# Patient Record
Sex: Female | Born: 2000 | Race: White | Hispanic: No | Marital: Single | Smoking: Never smoker
Health system: Southern US, Community
[De-identification: ages and names within clinical notes are randomized; demographics above are authoritative.]

## PROBLEM LIST (undated history)

## (undated) DIAGNOSIS — N760 Acute vaginitis: Secondary | ICD-10-CM

## (undated) DIAGNOSIS — A749 Chlamydial infection, unspecified: Secondary | ICD-10-CM

## (undated) DIAGNOSIS — B9689 Other specified bacterial agents as the cause of diseases classified elsewhere: Secondary | ICD-10-CM

## (undated) HISTORY — DX: Chlamydial infection, unspecified: A74.9

## (undated) HISTORY — DX: Other specified bacterial agents as the cause of diseases classified elsewhere: N76.0

## (undated) HISTORY — DX: Other specified bacterial agents as the cause of diseases classified elsewhere: B96.89

---
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
## 2018-01-04 IMAGING — MR MR LUMBAR SPINE W/O CM
4 of 5 series · 15 of 48 positions shown · non-contrast
Comparison: None.

CLINICAL DATA: Low back pain with bilateral groin pain over the
last 3 months.

EXAM:
MRI LUMBAR SPINE WITHOUT CONTRAST
TECHNIQUE: Multiplanar, multisequence MR imaging of the lumbar spine was
performed. No intravenous contrast was administered.

[Series 3: T2 · sagittal · 4.0mm · 0.44mm/px · 6 of 15 slices shown (1 of 2)]
[im 1/15]
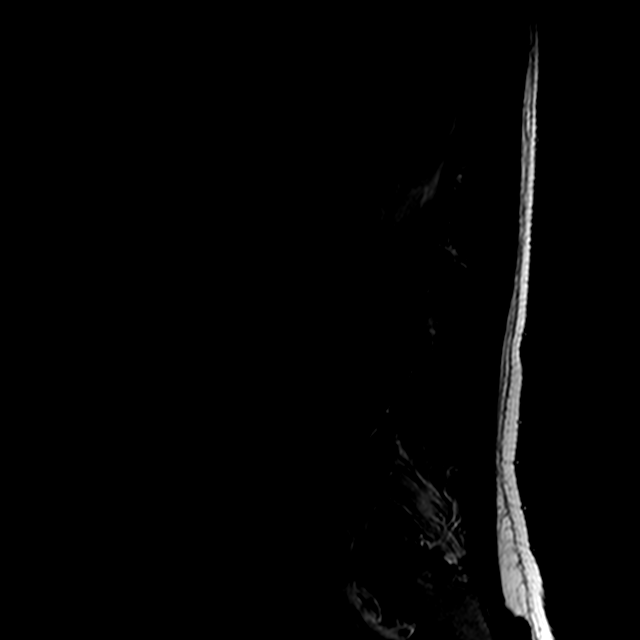
[im 3/15]
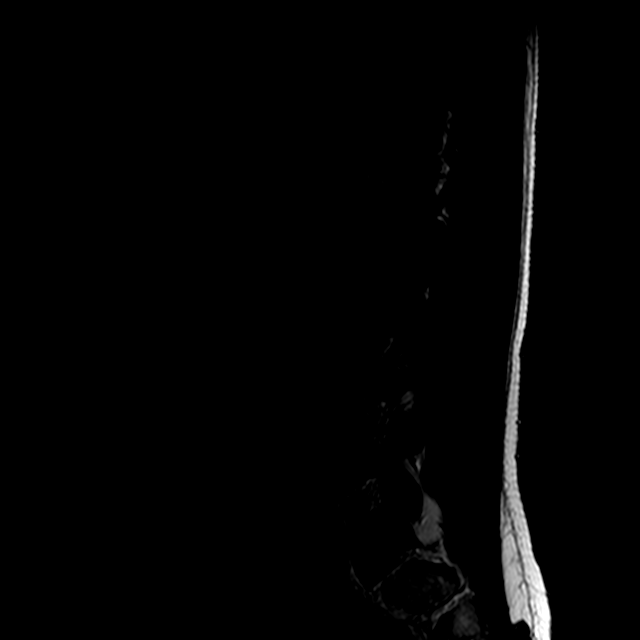
[im 6/15]
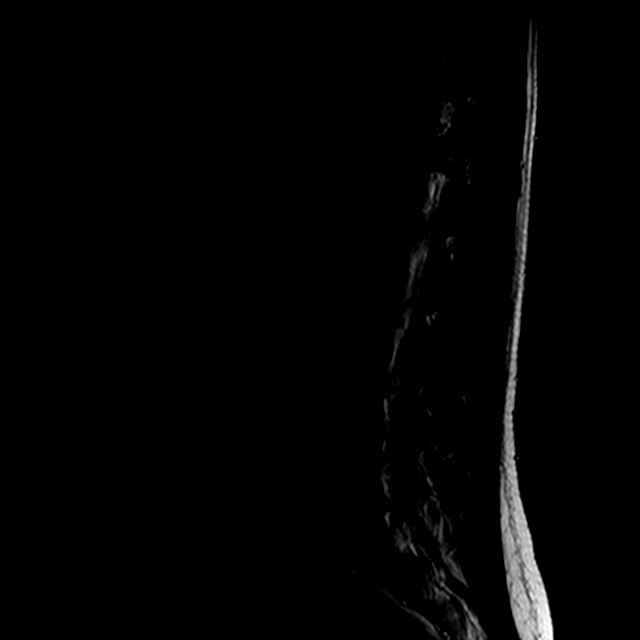
[im 9/15]
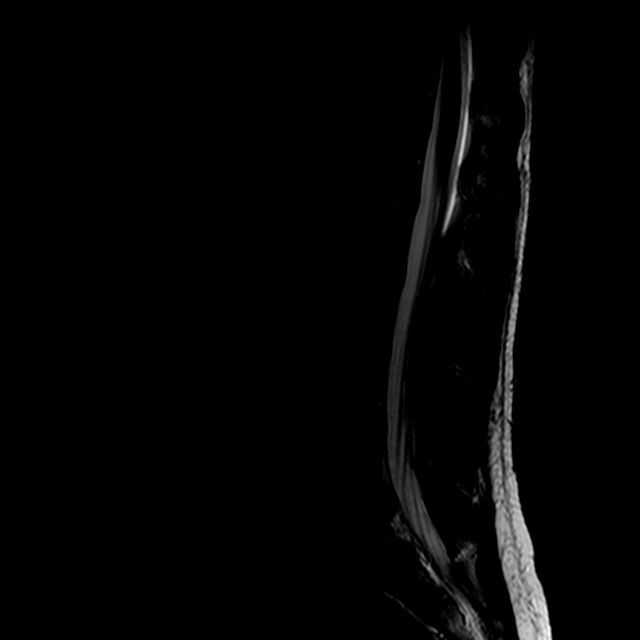
[im 12/15]
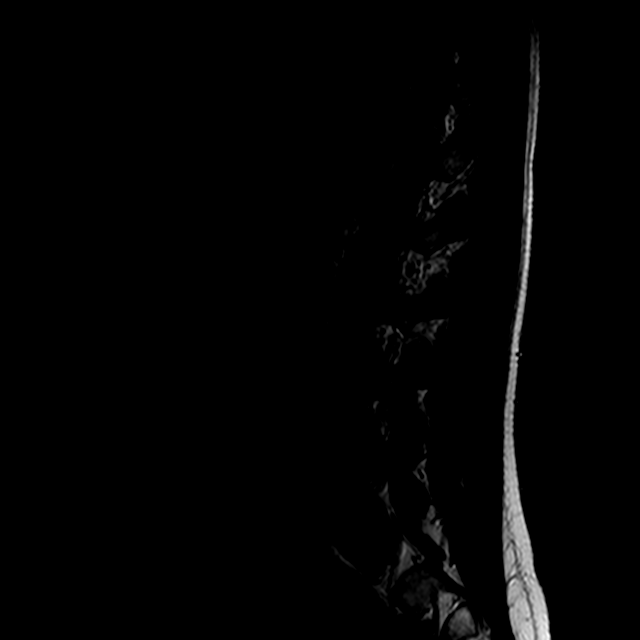
[im 15/15]
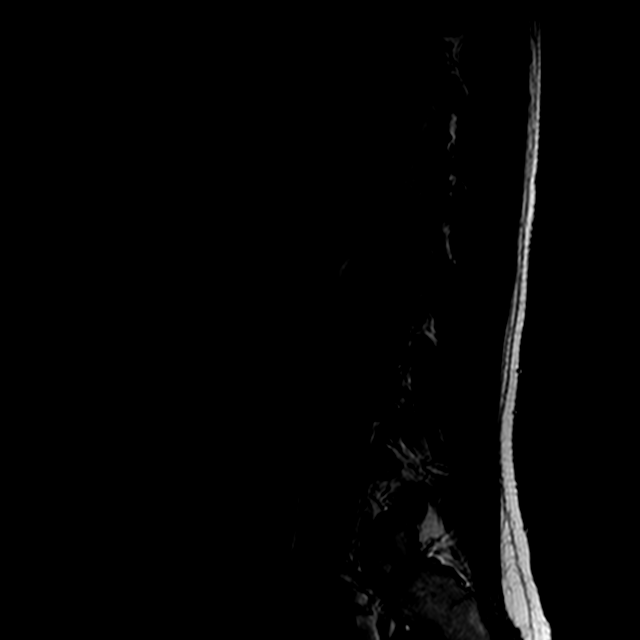

[Series 4: T1 · sagittal · 4.0mm · 0.44mm/px · 3 of 15 slices shown (1 of 2)]
[im 3/15]
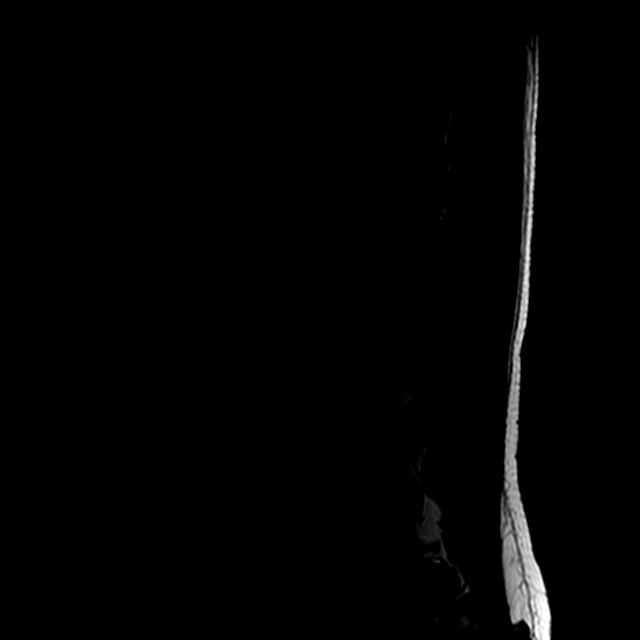
[im 9/15]
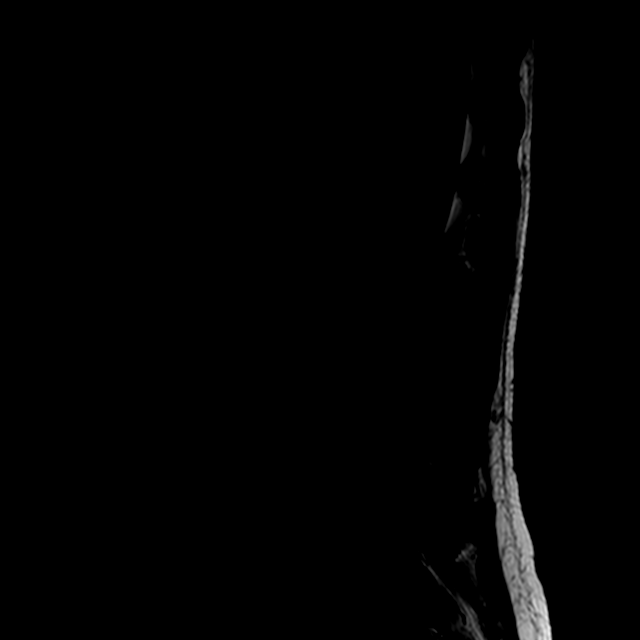
[im 15/15]
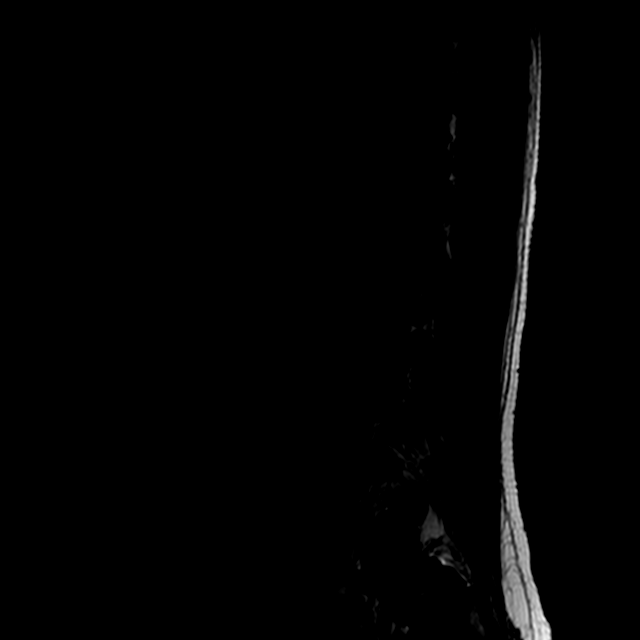

[Series 6: T2 · axial · 4.0mm · 0.39mm/px · z∈[-64,+92]mm · 3 of 38 slices shown (2 of 2)]
[im 6/38]
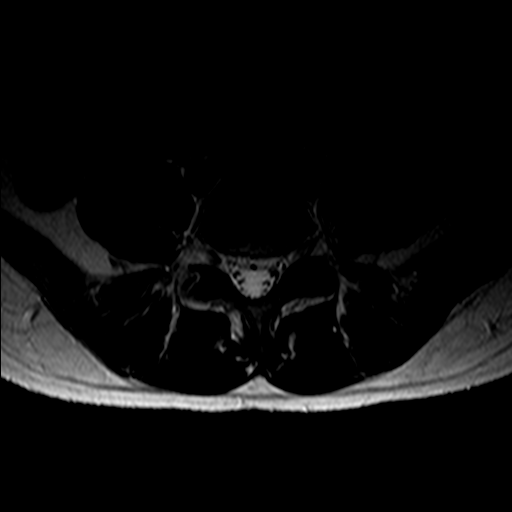
[im 19/38]
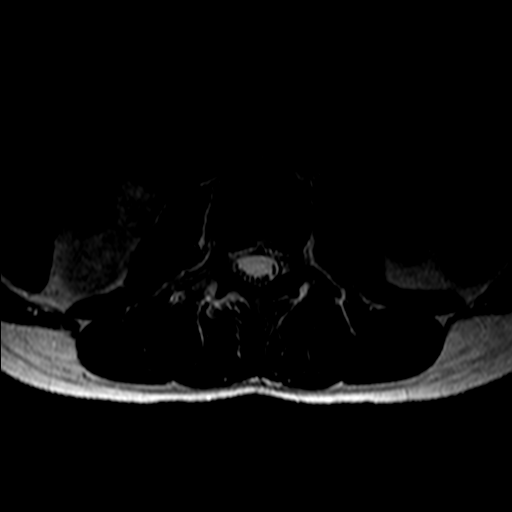
[im 32/38]
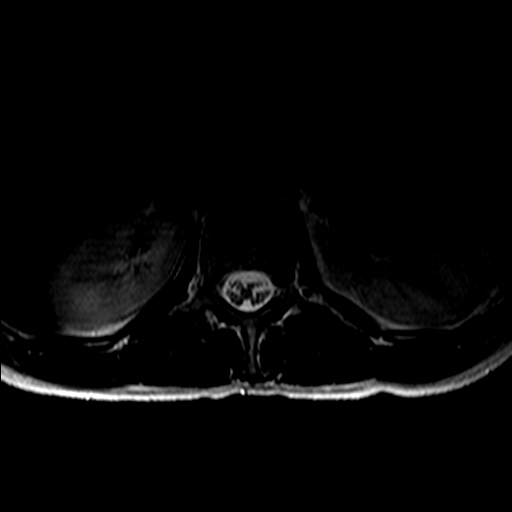

[Series 7: T1 · axial · 4.0mm · 0.39mm/px · z∈[-64,+92]mm · 3 of 38 slices shown (2 of 2)]
[im 6/38]
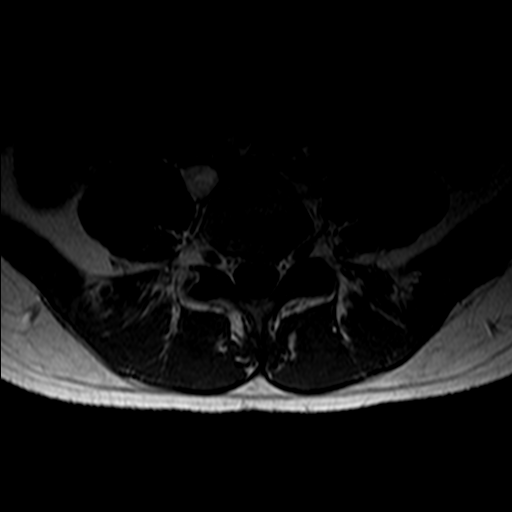
[im 19/38]
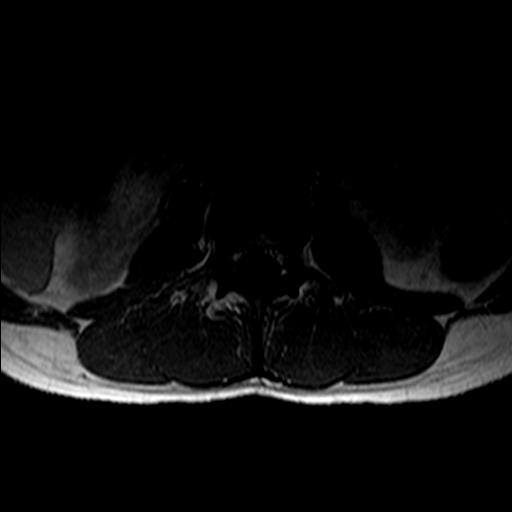
[im 32/38]
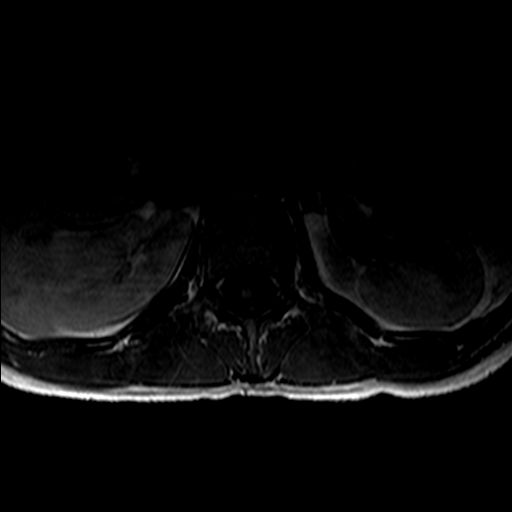

[15 of 48 positions shown; findings below may reference images not displayed]

FINDINGS: Segmentation:  5 lumbar type vertebral bodies.

Alignment:  Normal

Vertebrae:  Normal

Conus medullaris: Extends to the L1 level and appears normal.

Paraspinal and other soft tissues: Normal

Disc levels:

No abnormality at L4-5 or above. The discs are normal in signal
characteristics and morphology. Canal and foramina are widely
patent.

L5-S1: Disc degeneration with a central disc herniation. This
approaches the thecal sac in the S1 nerves but does not cause neural
compression or displacement. This could certainly be a cause of
symptoms.
IMPRESSION: Disc degeneration at L5-S1 with a central disc herniation. This does
not cause neural compression, but could be responsible for the
presenting symptoms.

## 2019-09-04 ENCOUNTER — Encounter: Payer: Self-pay | Admitting: Obstetrics and Gynecology

## 2020-09-16 ENCOUNTER — Other Ambulatory Visit: Payer: Self-pay

## 2020-09-16 ENCOUNTER — Encounter: Payer: Self-pay | Admitting: Obstetrics and Gynecology

## 2020-09-16 ENCOUNTER — Other Ambulatory Visit (HOSPITAL_COMMUNITY)
Admission: RE | Admit: 2020-09-16 | Discharge: 2020-09-16 | Disposition: A | Discharge status: Home / Self Care | Payer: BC Managed Care – PPO | Source: Ambulatory Visit | Attending: Obstetrics and Gynecology | Admitting: Obstetrics and Gynecology

## 2020-09-16 ENCOUNTER — Ambulatory Visit (INDEPENDENT_AMBULATORY_CARE_PROVIDER_SITE_OTHER): Payer: BC Managed Care – PPO | Admitting: Obstetrics and Gynecology

## 2020-09-16 VITALS — BP 120/60 | Ht 67.0 in | Wt 122.0 lb

## 2020-09-16 DIAGNOSIS — Z30011 Encounter for initial prescription of contraceptive pills: Secondary | ICD-10-CM

## 2020-09-16 DIAGNOSIS — N760 Acute vaginitis: Secondary | ICD-10-CM

## 2020-09-16 DIAGNOSIS — Z124 Encounter for screening for malignant neoplasm of cervix: Secondary | ICD-10-CM | POA: Diagnosis not present

## 2020-09-16 DIAGNOSIS — Z01419 Encounter for gynecological examination (general) (routine) without abnormal findings: Secondary | ICD-10-CM | POA: Diagnosis not present

## 2020-09-16 DIAGNOSIS — B9689 Other specified bacterial agents as the cause of diseases classified elsewhere: Secondary | ICD-10-CM | POA: Diagnosis not present

## 2020-09-16 DIAGNOSIS — Z113 Encounter for screening for infections with a predominantly sexual mode of transmission: Secondary | ICD-10-CM | POA: Insufficient documentation

## 2020-09-16 LAB — POCT WET PREP WITH KOH
Clue Cells Wet Prep HPF POC: POSITIVE
KOH Prep POC: POSITIVE — AB
Trichomonas, UA: NEGATIVE
Yeast Wet Prep HPF POC: NEGATIVE

## 2020-09-16 MED ORDER — DROSPIRENONE-ETHINYL ESTRADIOL 3-0.02 MG PO TABS: 1.0000 | ORAL_TABLET | Freq: Every day | ORAL | 11 refills | Status: DC

## 2020-09-16 MED ORDER — METRONIDAZOLE 500 MG PO TABS: 500.0000 mg | ORAL_TABLET | Freq: Two times a day (BID) | ORAL | 0 refills | Status: DC

## 2020-09-16 NOTE — Progress Notes (Signed)
PCP:  Erick Colace, MD   Chief Complaint  Patient presents with  . Gynecologic Exam  . Contraception    wants BC, currently no BC     HPI:      Ms. Misty Garza is a 19 y.o. G0P0 whose LMP was Patient's last menstrual period was 09/02/2020 (approximate)., presents today for her NP annual examination.  Her menses are regular every 28-30 days, lasting 5 days.  Dysmenorrhea mild, occurring first 1-2 days of flow. She does not have intermenstrual bleeding.  Sex activity: single partner, contraception - condoms sometimes. Did sprintec in the past with mood changes. Would like new BC. No hx of HTN, DVTs, migraines with aura.  Last Pap: never due to age Hx of STDs: chlamydia 2 months ago, treated with doxy and rocephin. Had neg TOC, partner treated.  Having issues with fishy vag odor, no d/c or irritation. Has tried repHresh. No hx of BV.   There is no FH of breast cancer. There is no FH of ovarian cancer. The patient does not do self-breast exams.  Tobacco use: The patient denies current or previous tobacco use. Alcohol use: none No drug use.  Exercise: moderately active  She does get adequate calcium but not Vitamin D in her diet. Gardasil not done.    Past Medical History:  Diagnosis Date  . Chlamydia     Past Surgical History:  Procedure Laterality Date  . tubs in ears  2002    History reviewed. No pertinent family history.  Social History   Socioeconomic History  . Marital status: Single    Spouse name: Not on file  . Number of children: Not on file  . Years of education: Not on file  . Highest education level: Not on file  Occupational History  . Not on file  Tobacco Use  . Smoking status: Never Smoker  . Smokeless tobacco: Never Used  Vaping Use  . Vaping Use: Never used  Substance and Sexual Activity  . Alcohol use: No  . Drug use: No  . Sexual activity: Yes    Birth control/protection: Pill, Condom  Other Topics Concern  . Not on file  Social  History Narrative  . Not on file   Social Determinants of Health   Financial Resource Strain:   . Difficulty of Paying Living Expenses: Not on file  Food Insecurity:   . Worried About Programme researcher, broadcasting/film/video in the Last Year: Not on file  . Ran Out of Food in the Last Year: Not on file  Transportation Needs:   . Lack of Transportation (Medical): Not on file  . Lack of Transportation (Non-Medical): Not on file  Physical Activity:   . Days of Exercise per Week: Not on file  . Minutes of Exercise per Session: Not on file  Stress:   . Feeling of Stress : Not on file  Social Connections:   . Frequency of Communication with Friends and Family: Not on file  . Frequency of Social Gatherings with Friends and Family: Not on file  . Attends Religious Services: Not on file  . Active Member of Clubs or Organizations: Not on file  . Attends Banker Meetings: Not on file  . Marital Status: Not on file  Intimate Partner Violence:   . Fear of Current or Ex-Partner: Not on file  . Emotionally Abused: Not on file  . Physically Abused: Not on file  . Sexually Abused: Not on file     Current  Outpatient Medications:  .  drospirenone-ethinyl estradiol (YAZ) 3-0.02 MG tablet, Take 1 tablet by mouth daily., Disp: 28 tablet, Rfl: 11 .  metroNIDAZOLE (FLAGYL) 500 MG tablet, Take 1 tablet (500 mg total) by mouth 2 (two) times daily for 7 days., Disp: 14 tablet, Rfl: 0     ROS:  Review of Systems  Constitutional: Negative for fatigue, fever and unexpected weight change.  Respiratory: Negative for cough, shortness of breath and wheezing.   Cardiovascular: Negative for chest pain, palpitations and leg swelling.  Gastrointestinal: Negative for blood in stool, constipation, diarrhea, nausea and vomiting.  Endocrine: Negative for cold intolerance, heat intolerance and polyuria.  Genitourinary: Negative for dyspareunia, dysuria, flank pain, frequency, genital sores, hematuria, menstrual  problem, pelvic pain, urgency, vaginal bleeding, vaginal discharge and vaginal pain.  Musculoskeletal: Negative for back pain, joint swelling and myalgias.  Skin: Negative for rash.  Neurological: Negative for dizziness, syncope, light-headedness, numbness and headaches.  Hematological: Negative for adenopathy.  Psychiatric/Behavioral: Negative for agitation, confusion, sleep disturbance and suicidal ideas. The patient is not nervous/anxious.    BREAST: No symptoms   Objective: BP 120/60   Ht 5\' 7"  (1.702 m)   Wt 122 lb (55.3 kg)   LMP 09/02/2020 (Approximate)   BMI 19.11 kg/m    Physical Exam Constitutional:      Appearance: She is well-developed.  Genitourinary:     Vulva, cervix, uterus, right adnexa and left adnexa normal.     No vulval lesion or tenderness noted.     Vaginal discharge present.     No vaginal erythema or tenderness.     No cervical polyp.     Uterus is not enlarged or tender.     No right or left adnexal mass present.     Right adnexa not tender.     Left adnexa not tender.  Neck:     Thyroid: No thyromegaly.  Cardiovascular:     Rate and Rhythm: Normal rate and regular rhythm.     Heart sounds: Normal heart sounds. No murmur heard.   Pulmonary:     Effort: Pulmonary effort is normal.     Breath sounds: Normal breath sounds.  Chest:     Breasts:        Right: No mass, nipple discharge, skin change or tenderness.        Left: No mass, nipple discharge, skin change or tenderness.  Abdominal:     Palpations: Abdomen is soft.     Tenderness: There is no abdominal tenderness. There is no guarding.  Musculoskeletal:        General: Normal range of motion.     Cervical back: Normal range of motion.  Neurological:     General: No focal deficit present.     Mental Status: She is alert and oriented to person, place, and time.     Cranial Nerves: No cranial nerve deficit.  Skin:    General: Skin is warm and dry.  Psychiatric:        Mood and Affect:  Mood normal.        Behavior: Behavior normal.        Thought Content: Thought content normal.        Judgment: Judgment normal.  Vitals reviewed.     Results: Results for orders placed or performed in visit on 09/16/20 (from the past 24 hour(s))  POCT Wet Prep with KOH     Status: Abnormal   Collection Time: 09/16/20  5:02 PM  Result  Value Ref Range   Trichomonas, UA Negative    Clue Cells Wet Prep HPF POC pos    Epithelial Wet Prep HPF POC     Yeast Wet Prep HPF POC neg    Bacteria Wet Prep HPF POC     RBC Wet Prep HPF POC     WBC Wet Prep HPF POC     KOH Prep POC Positive (A) Negative    Assessment/Plan: Encounter for annual routine gynecological examination  Cervical cancer screening - Plan: Cytology - PAP  Screening for STD (sexually transmitted disease) - Plan: Cytology - PAP  Encounter for initial prescription of contraceptive pills - Plan: drospirenone-ethinyl estradiol (YAZ) 3-0.02 MG tablet; BC options discussed. Pt wants to try OCPs again. Rx yaz, start with next menses, condoms for 1 wk. F/u prn mood changes.   Bacterial vaginosis - Plan: metroNIDAZOLE (FLAGYL) 500 MG tablet, POCT Wet Prep with KOH; pos sx and wet prep. Rx flagyl, no EtOH. Will RF if sx recur. F/u prn.   Meds ordered this encounter  Medications  . metroNIDAZOLE (FLAGYL) 500 MG tablet    Sig: Take 1 tablet (500 mg total) by mouth 2 (two) times daily for 7 days.    Dispense:  14 tablet    Refill:  0    Order Specific Question:   Supervising Provider    Answer:   Nadara Mustard [id number]  . drospirenone-ethinyl estradiol (YAZ) 3-0.02 MG tablet    Sig: Take 1 tablet by mouth daily.    Dispense:  28 tablet    Refill:  11    Order Specific Question:   Supervising Provider    Answer:   Nadara Mustard [[id number]]             GYN counsel STD prevention, adequate intake of calcium and vitamin D, diet and exercise, Gardasil discussed     F/U  Return in about 1 year (around 09/16/2021).  Misty Garza  B. Sally-Ann Cutbirth, PA-C 09/16/2020 5:02 PM

## 2020-09-16 NOTE — Patient Instructions (Signed)
I value your feedback and entrusting us with your care. If you get a [facility] patient survey, I would appreciate you taking the time to let us know about your experience today. Thank you!  As of October 10, 2019, your lab results will be released to your MyChart immediately, before I even have a chance to see them. Please give me time to review them and contact you if there are any abnormalities. Thank you for your patience.  

## 2020-09-21 LAB — CYTOLOGY - PAP
Chlamydia: NEGATIVE
Comment: NEGATIVE
Comment: NORMAL
Diagnosis: NEGATIVE
Neisseria Gonorrhea: NEGATIVE

## 2020-10-12 ENCOUNTER — Other Ambulatory Visit: Payer: Self-pay | Admitting: Obstetrics and Gynecology

## 2020-10-12 DIAGNOSIS — B9689 Other specified bacterial agents as the cause of diseases classified elsewhere: Secondary | ICD-10-CM

## 2020-10-12 MED ORDER — METRONIDAZOLE 500 MG PO TABS: 500.0000 mg | ORAL_TABLET | Freq: Two times a day (BID) | ORAL | 0 refills | Status: AC

## 2020-10-12 NOTE — Progress Notes (Signed)
Rx RF flagyl for BV sx.  

## 2021-02-10 DIAGNOSIS — N3000 Acute cystitis without hematuria: Secondary | ICD-10-CM | POA: Diagnosis not present

## 2021-02-10 DIAGNOSIS — R300 Dysuria: Secondary | ICD-10-CM | POA: Diagnosis not present

## 2021-02-16 DIAGNOSIS — L0231 Cutaneous abscess of buttock: Secondary | ICD-10-CM | POA: Diagnosis not present

## 2021-02-16 DIAGNOSIS — Z880 Allergy status to penicillin: Secondary | ICD-10-CM | POA: Diagnosis not present

## 2021-02-16 DIAGNOSIS — L03317 Cellulitis of buttock: Secondary | ICD-10-CM | POA: Diagnosis not present

## 2021-02-16 DIAGNOSIS — F909 Attention-deficit hyperactivity disorder, unspecified type: Secondary | ICD-10-CM | POA: Diagnosis not present

## 2021-02-16 DIAGNOSIS — Z79899 Other long term (current) drug therapy: Secondary | ICD-10-CM | POA: Diagnosis not present

## 2021-06-06 ENCOUNTER — Ambulatory Visit
Admission: EM | Admit: 2021-06-06 | Discharge: 2021-06-06 | Disposition: A | Discharge status: Home / Self Care | Payer: BC Managed Care – PPO

## 2021-06-06 ENCOUNTER — Encounter: Payer: Self-pay | Admitting: Emergency Medicine

## 2021-06-06 DIAGNOSIS — S00451A Superficial foreign body of right ear, initial encounter: Secondary | ICD-10-CM

## 2021-06-06 NOTE — ED Provider Notes (Addendum)
Chief Complaint   Chief Complaint  Patient presents with   Foreign Body in Ear    Rt ear lobe     Subjective, HPI  Misty Garza is a very pleasant 20 y.o. female who presents with earring stuck in right ear which she noticed about an hour ago.  Patient states that she had the ear pierced about a month ago.  She states that she has been using saline sprays to the area which has not seemed to help.  Patient states that she has been unable to dislodge the earring.  Patient does report some swelling in the area.  She does not report any fever, chills, vomiting or associated headache.  History obtained from patient.   Patient's problem list, past medical and social history, medications, and allergies were reviewed by me and updated in Epic.    ROS  See HPI.  Objective   Vitals:   06/06/21 1448  BP: 114/77  Pulse: 86  Resp: 18  Temp: 98.8 F (37.1 C)  SpO2: 99%    Vital signs and nursing note reviewed.  General: Appears well-developed and well-nourished. No acute distress.  HEENT: Normocephalic, atraumatic, hearing grossly intact. EOMI, no drainage. No rhinorrhea. Moist mucous membranes.  Neck: Normal range of motion, neck is supple.  Cardiovascular: Normal rate.  Pulm/Chest: No respiratory distress.   Musculoskeletal: No joint deformity, normal range of motion.  Skin: Erythematous swollen right earlobe with embedded earring beneath the skin of the anterior earlobe.  Some mild drainage of blood noted.  Procedure: Area cleaned in the usual fashion with alcohol and saline.  Injected approximately 0.5 mL of 1% lidocaine without epinephrine to the posterior and anterior aspect of the right ear.  Able to press the back of the earring to push the anterior portion of the earring through the patient's ear, removed the back of the earring and was able to remove the earring in its entirety.  Patient tolerated well.  Area cleaned with alcohol and saline, bleeding controlled.   Approximately 1 mL of blood loss.  Applied topical bacitracin ointment.  Data  No results found for any visits on 06/06/21.   Assessment & Plan  1. Foreign body of right ear lobe, initial encounter  20 y.o. female presents with earring stuck in right ear which she noticed about an hour ago.  Patient states that she had the ear pierced about a month ago.  She states that she has been using saline sprays to the area which has not seemed to help.  Patient states that she has been unable to dislodge the earring.  Patient does report some swelling in the area.  She does not report any fever, chills, vomiting or associated headache.  Chart review completed.  Earring able to be successfully removed from the patient's ear.  There is associated mild swelling, but no concern for infection at this time.  Injected 0.5 mL of 1% lidocaine without epinephrine during procedure.  Advised about home treatment and care as outlined in her AVS to include saline sprays, keeping the area clean and dry, use of topical triple antibiotic ointment, and watching for signs and symptoms of infection to include fever, chills, pus drainage, worsening swelling.  Patient verbalized understanding and agreed with plan.  Patient stable upon discharge.  Return as needed.  Visit for procedure only.  Plan:   Discharge Instructions      Keep the area clean and dry.  Clean the area with saline spray.  Topical antibiotic ointment  as directed.  If you begin to notice signs or symptoms of infection to include fever, chills, pus drainage or worsening swelling return to clinic for reevaluation.         Amalia Greenhouse, FNP 06/06/21 1610    Amalia Greenhouse, [place] 06/06/21 [phone]

## 2021-06-06 NOTE — Discharge Instructions (Addendum)
Keep the area clean and dry.  Clean the area with saline spray.  Topical antibiotic ointment as directed.  If you begin to notice signs or symptoms of infection to include fever, chills, pus drainage or worsening swelling return to clinic for reevaluation.

## 2021-06-06 NOTE — ED Triage Notes (Signed)
Pt presents today with piece of earring stuck in right ear x 1 hr.

## 2022-11-14 ENCOUNTER — Encounter: Payer: Self-pay | Admitting: Obstetrics and Gynecology

## 2022-11-14 NOTE — Progress Notes (Unsigned)
PCP:  Gregary Signs, MD   No chief complaint on file.    HPI:      Ms. Misty Garza is a 22 y.o. G0P0 whose LMP was No LMP recorded., presents today for her NP annual examination.  Her menses are regular every 28-30 days, lasting 5 days.  Dysmenorrhea mild, occurring first 1-2 days of flow. She does not have intermenstrual bleeding.  Sex activity: single partner, contraception - condoms sometimes. Did sprintec in the past with mood changes. Would like new BC. No hx of HTN, DVTs, migraines with aura.  Last Pap: 09/16/20 Results were normal Hx of STDs: chlamydia  Having issues with fishy vag odor, no d/c or irritation. Has tried repHresh. No hx of BV.   There is no FH of breast cancer. There is no FH of ovarian cancer. The patient does not do self-breast exams.  Tobacco use: The patient denies current or previous tobacco use. Alcohol use: none No drug use.  Exercise: moderately active  She does get adequate calcium but not Vitamin D in her diet. Gardasil not done.    Past Medical History:  Diagnosis Date   Chlamydia     Past Surgical History:  Procedure Laterality Date   tubs in ears  2002    No family history on file.  Social History   Socioeconomic History   Marital status: Single    Spouse name: Not on file   Number of children: Not on file   Years of education: Not on file   Highest education level: Not on file  Occupational History   Not on file  Tobacco Use   Smoking status: Never   Smokeless tobacco: Never  Vaping Use   Vaping Use: Never used  Substance and Sexual Activity   Alcohol use: No   Drug use: No   Sexual activity: Yes    Birth control/protection: Condom  Other Topics Concern   Not on file  Social History Narrative   Not on file   Social Determinants of Health   Financial Resource Strain: Not on file  Food Insecurity: Not on file  Transportation Needs: Not on file  Physical Activity: Not on file  Stress: Not on file  Social  Connections: Not on file  Intimate Partner Violence: Not on file     Current Outpatient Medications:    drospirenone-ethinyl estradiol (YAZ) 3-0.02 MG tablet, Take 1 tablet by mouth daily., Disp: 28 tablet, Rfl: 11     ROS:  Review of Systems  Constitutional:  Negative for fatigue, fever and unexpected weight change.  Respiratory:  Negative for cough, shortness of breath and wheezing.   Cardiovascular:  Negative for chest pain, palpitations and leg swelling.  Gastrointestinal:  Negative for blood in stool, constipation, diarrhea, nausea and vomiting.  Endocrine: Negative for cold intolerance, heat intolerance and polyuria.  Genitourinary:  Negative for dyspareunia, dysuria, flank pain, frequency, genital sores, hematuria, menstrual problem, pelvic pain, urgency, vaginal bleeding, vaginal discharge and vaginal pain.  Musculoskeletal:  Negative for back pain, joint swelling and myalgias.  Skin:  Negative for rash.  Neurological:  Negative for dizziness, syncope, light-headedness, numbness and headaches.  Hematological:  Negative for adenopathy.  Psychiatric/Behavioral:  Negative for agitation, confusion, sleep disturbance and suicidal ideas. The patient is not nervous/anxious.    BREAST: No symptoms   Objective: There were no vitals taken for this visit.   Physical Exam Constitutional:      Appearance: She is well-developed.  Genitourinary:  Vulva normal.     Vaginal discharge present.     No vaginal erythema or tenderness.      Right Adnexa: not tender and no mass present.    Left Adnexa: not tender and no mass present.    No cervical polyp.     Uterus is not enlarged or tender.  Breasts:    Right: No mass, nipple discharge, skin change or tenderness.     Left: No mass, nipple discharge, skin change or tenderness.  Neck:     Thyroid: No thyromegaly.  Cardiovascular:     Rate and Rhythm: Normal rate and regular rhythm.     Heart sounds: Normal heart sounds. No  murmur heard. Pulmonary:     Effort: Pulmonary effort is normal.     Breath sounds: Normal breath sounds.  Abdominal:     Palpations: Abdomen is soft.     Tenderness: There is no abdominal tenderness. There is no guarding.  Musculoskeletal:        General: Normal range of motion.     Cervical back: Normal range of motion.  Neurological:     General: No focal deficit present.     Mental Status: She is alert and oriented to person, place, and time.     Cranial Nerves: No cranial nerve deficit.  Skin:    General: Skin is warm and dry.  Psychiatric:        Mood and Affect: Mood normal.        Behavior: Behavior normal.        Thought Content: Thought content normal.        Judgment: Judgment normal.  Vitals reviewed.     Results: No results found for this or any previous visit (from the past 24 hour(s)).   Assessment/Plan: Encounter for annual routine gynecological examination  Cervical cancer screening - Plan: Cytology - PAP  Screening for STD (sexually transmitted disease) - Plan: Cytology - PAP  Encounter for initial prescription of contraceptive pills - Plan: drospirenone-ethinyl estradiol (YAZ) 3-0.02 MG tablet; BC options discussed. Pt wants to try OCPs again. Rx yaz, start with next menses, condoms for 1 wk. F/u prn mood changes.   Bacterial vaginosis - Plan: metroNIDAZOLE (FLAGYL) 500 MG tablet, POCT Wet Prep with KOH; pos sx and wet prep. Rx flagyl, no EtOH. Will RF if sx recur. F/u prn.   No orders of the defined types were placed in this encounter.            GYN counsel STD prevention, adequate intake of calcium and vitamin D, diet and exercise, Gardasil discussed     F/U  No follow-ups on file.  Raegen Tarpley B. Marcelino Campos, PA-C 11/14/2022 9:48 PM

## 2022-11-15 ENCOUNTER — Other Ambulatory Visit (HOSPITAL_COMMUNITY)
Admission: RE | Admit: 2022-11-15 | Discharge: 2022-11-15 | Disposition: A | Discharge status: Home / Self Care | Payer: BC Managed Care – PPO | Source: Ambulatory Visit | Attending: Obstetrics and Gynecology | Admitting: Obstetrics and Gynecology

## 2022-11-15 ENCOUNTER — Encounter: Payer: Self-pay | Admitting: Obstetrics and Gynecology

## 2022-11-15 ENCOUNTER — Ambulatory Visit (INDEPENDENT_AMBULATORY_CARE_PROVIDER_SITE_OTHER): Payer: BC Managed Care – PPO | Admitting: Obstetrics and Gynecology

## 2022-11-15 VITALS — BP 100/70 | Ht 67.0 in | Wt 121.0 lb

## 2022-11-15 DIAGNOSIS — N946 Dysmenorrhea, unspecified: Secondary | ICD-10-CM | POA: Diagnosis not present

## 2022-11-15 DIAGNOSIS — Z3009 Encounter for other general counseling and advice on contraception: Secondary | ICD-10-CM

## 2022-11-15 DIAGNOSIS — Z01411 Encounter for gynecological examination (general) (routine) with abnormal findings: Secondary | ICD-10-CM

## 2022-11-15 DIAGNOSIS — Z124 Encounter for screening for malignant neoplasm of cervix: Secondary | ICD-10-CM

## 2022-11-15 DIAGNOSIS — Z113 Encounter for screening for infections with a predominantly sexual mode of transmission: Secondary | ICD-10-CM | POA: Insufficient documentation

## 2022-11-15 DIAGNOSIS — Z01419 Encounter for gynecological examination (general) (routine) without abnormal findings: Secondary | ICD-10-CM

## 2022-11-15 DIAGNOSIS — Z3041 Encounter for surveillance of contraceptive pills: Secondary | ICD-10-CM

## 2022-11-15 HISTORY — DX: Dysmenorrhea, unspecified: N94.6

## 2022-11-15 NOTE — Patient Instructions (Signed)
I value your feedback and you entrusting us with your care. If you get a [facility] patient survey, I would appreciate you taking the time to let us know about your experience today. Thank you! ? ? ?

## 2022-11-17 LAB — CYTOLOGY - PAP
Chlamydia: NEGATIVE
Comment: NEGATIVE
Comment: NORMAL
Diagnosis: NEGATIVE
Neisseria Gonorrhea: NEGATIVE

## 2023-03-07 ENCOUNTER — Ambulatory Visit
Admission: EM | Admit: 2023-03-07 | Discharge: 2023-03-07 | Disposition: A | Discharge status: Home / Self Care | Payer: BC Managed Care – PPO | Attending: Urgent Care | Admitting: Urgent Care

## 2023-03-07 DIAGNOSIS — N3001 Acute cystitis with hematuria: Secondary | ICD-10-CM

## 2023-03-07 LAB — POCT URINALYSIS DIP (MANUAL ENTRY)
Bilirubin, UA: NEGATIVE
Glucose, UA: NEGATIVE mg/dL
Ketones, POC UA: NEGATIVE mg/dL
Nitrite, UA: NEGATIVE
Protein Ur, POC: 100 mg/dL — AB
Spec Grav, UA: >=1.03 — AB (ref 1.010–1.025)
Urobilinogen, UA: 0.2 E.U./dL
pH, UA: 6 (ref 5.0–8.0)

## 2023-03-07 MED ORDER — NITROFURANTOIN MONOHYD MACRO 100 MG PO CAPS: 100.0000 mg | ORAL_CAPSULE | Freq: Two times a day (BID) | ORAL | 0 refills | Status: DC

## 2023-03-07 NOTE — ED Provider Notes (Addendum)
Renaldo Fiddler    CSN: [id number] Arrival date & time: 03/07/23  4098      History   Chief Complaint Chief Complaint  Patient presents with   Dysuria    HPI Glendene Langbein is a 22 y.o. female.    Dysuria   Presents with complaint of dysuria starting yesterday. Denies flank pain. Denies fever. Denies suprapubic pain.  Past Medical History:  Diagnosis Date   BV (bacterial vaginosis)    Chlamydia     Patient Active Problem List   Diagnosis Date Noted   Dysmenorrhea 11/15/2022   Skin cyst 11/01/2013    Past Surgical History:  Procedure Laterality Date   tubs in ears  2002    OB History     Gravida  0   Para      Term      Preterm      AB      Living         SAB      IAB      Ectopic      Multiple      Live Births               Home Medications    Prior to Admission medications   Not on File    Family History History reviewed. No pertinent family history.  Social History Social History   Tobacco Use   Smoking status: Never   Smokeless tobacco: Never  Vaping Use   Vaping Use: Never used  Substance Use Topics   Alcohol use: No   Drug use: No     Allergies   Amoxicillin   Review of Systems Review of Systems  Genitourinary:  Positive for dysuria.     Physical Exam Triage Vital Signs ED Triage Vitals  Enc Vitals Group     BP 03/07/23 0944 131/73     Pulse Rate 03/07/23 0944 71     Resp 03/07/23 0944 16     Temp 03/07/23 0944 97.9 F (36.6 C)     Temp Source 03/07/23 0944 Temporal     SpO2 03/07/23 0944 98 %     Weight --      Height --      Head Circumference --      Peak Flow --      Pain Score 03/07/23 0943 0     Pain Loc --      Pain Edu? --      Excl. in GC? --    No data found.  Updated Vital Signs BP 131/73 (BP Location: Left Arm)   Pulse 71   Temp 97.9 F (36.6 C) (Temporal)   Resp 16   LMP 02/27/2023 (Approximate)   SpO2 98%   Visual Acuity Right Eye Distance:   Left Eye  Distance:   Bilateral Distance:    Right Eye Near:   Left Eye Near:    Bilateral Near:     Physical Exam Vitals reviewed.  Constitutional:      Appearance: Normal appearance.  Skin:    General: Skin is warm and dry.  Neurological:     General: No focal deficit present.     Mental Status: She is alert and oriented to person, place, and time.  Psychiatric:        Mood and Affect: Mood normal.        Behavior: Behavior normal.      UC Treatments / Results  Labs (all labs ordered are listed,  but only abnormal results are displayed) Labs Reviewed  POCT URINALYSIS DIP (MANUAL ENTRY) - Abnormal; Notable for the following components:      Result Value   Clarity, UA cloudy (*)    Spec Grav, UA >=1.030 (*)    Blood, UA large (*)    Protein Ur, POC =100 (*)    Leukocytes, UA Large (3+) (*)    All other components within normal limits    EKG   Radiology No results found.  Procedures Procedures (including critical care time)  Medications Ordered in UC Medications - No data to display  Initial Impression / Assessment and Plan / UC Course  I have reviewed the triage vital signs and the nursing notes.  Pertinent labs & imaging results that were available during my care of the patient were reviewed by me and considered in my medical decision making (see chart for details).   UA result is strongly indicative of UTI with large leukocytes present and large blood.  Antibacterial therapy is prescribed using Macrobid given she does not tolerate penicillins. She is cautioned against pregnancy while taking this medication.  Counseled patient on potential for adverse effects with medications prescribed/recommended today, ER and return-to-clinic precautions discussed, patient verbalized understanding and agreement with care plan.   Final Clinical Impressions(s) / UC Diagnoses   Final diagnoses:  None   Discharge Instructions   None    ED Prescriptions   None    PDMP not  reviewed this encounter.   Charma Igo, FNP 03/07/23 1006    ImmordinoJeannett Senior, [place] 03/07/23 1010

## 2023-03-07 NOTE — ED Triage Notes (Signed)
Patient presents to UC for dysuria since yesterday. Not taking any OTC meds.

## 2023-03-07 NOTE — Discharge Instructions (Signed)
Follow up here or with your primary care provider if your symptoms are worsening or not improving.     

## 2023-07-06 ENCOUNTER — Other Ambulatory Visit (HOSPITAL_COMMUNITY)
Admission: RE | Admit: 2023-07-06 | Discharge: 2023-07-06 | Disposition: A | Discharge status: Home / Self Care | Payer: BC Managed Care – PPO | Source: Ambulatory Visit | Attending: Obstetrics and Gynecology | Admitting: Obstetrics and Gynecology

## 2023-07-06 ENCOUNTER — Ambulatory Visit (INDEPENDENT_AMBULATORY_CARE_PROVIDER_SITE_OTHER): Payer: BC Managed Care – PPO

## 2023-07-06 VITALS — BP 108/75 | HR 65 | Ht 67.0 in | Wt 120.0 lb

## 2023-07-06 DIAGNOSIS — N898 Other specified noninflammatory disorders of vagina: Secondary | ICD-10-CM

## 2023-07-06 DIAGNOSIS — Z113 Encounter for screening for infections with a predominantly sexual mode of transmission: Secondary | ICD-10-CM | POA: Insufficient documentation

## 2023-07-06 NOTE — Progress Notes (Signed)
    NURSE VISIT NOTE  Subjective:    Patient ID: Misty Garza, female    DOB: [birth year], 22 y.o.   MRN: [id number]  HPI  Patient is a 22 y.o. G0P0 female who presents for vaginal odor for a couple days.Denies abnormal vaginal bleeding or significant pelvic pain or fever.Patient denies history of known exposure to STD.Patient verified her label for ancillary swab that she self collected in office.     Objective:    BP 108/75   Pulse 65   Ht 5\' 7"  (1.702 m)   Wt 120 lb (54.4 kg)   BMI 18.79 kg/m      Assessment:   1. Vaginal odor       Plan:   GC and chlamydia DNA  probe sent to lab.  ROV prn if symptoms persist or worsen.   Loney Laurence, CMA

## 2023-07-10 LAB — CERVICOVAGINAL ANCILLARY ONLY
Bacterial Vaginitis (gardnerella): NEGATIVE
Candida Glabrata: NEGATIVE
Candida Vaginitis: NEGATIVE
Chlamydia: NEGATIVE
Comment: NEGATIVE
Comment: NEGATIVE
Comment: NEGATIVE
Comment: NEGATIVE
Comment: NEGATIVE
Comment: NORMAL
Neisseria Gonorrhea: NEGATIVE
Trichomonas: NEGATIVE

## 2023-08-04 DIAGNOSIS — R309 Painful micturition, unspecified: Secondary | ICD-10-CM | POA: Diagnosis not present

## 2023-08-04 DIAGNOSIS — N3001 Acute cystitis with hematuria: Secondary | ICD-10-CM | POA: Diagnosis not present

## 2023-10-17 ENCOUNTER — Ambulatory Visit (INDEPENDENT_AMBULATORY_CARE_PROVIDER_SITE_OTHER): Payer: BC Managed Care – PPO

## 2023-10-17 VITALS — BP 114/79 | HR 71 | Ht 67.0 in | Wt 124.0 lb

## 2023-10-17 DIAGNOSIS — Z3200 Encounter for pregnancy test, result unknown: Secondary | ICD-10-CM

## 2023-10-17 DIAGNOSIS — Z3201 Encounter for pregnancy test, result positive: Secondary | ICD-10-CM | POA: Diagnosis not present

## 2023-10-17 DIAGNOSIS — N912 Amenorrhea, unspecified: Secondary | ICD-10-CM

## 2023-10-17 LAB — POCT URINE PREGNANCY: Preg Test, Ur: POSITIVE — AB

## 2023-10-17 NOTE — Progress Notes (Signed)
    NURSE VISIT NOTE  Subjective:    Patient ID: Yazmyne Siedlecki, female    DOB: 2001-01-06, 22 y.o.   MRN: [id number]  HPI  Patient is a 22 y.o. G1P0 female who presents for evaluation of amenorrhea. She believes she could be pregnant. Pregnancy is desired.. Current symptoms also include: breast tenderness, fatigue, morning sickness, nausea, and flu like symptoms .Is unsure when exact date of LMP is she knows it was at the begging of November.     Objective:    BP 114/79   Pulse 71   Wt 124 lb (56.2 kg)   LMP 09/02/2023 (Within Days)   BMI 19.42 kg/m   Lab Review  Results for orders placed or performed in visit on 10/17/23  POCT urine pregnancy  Result Value Ref Range   Preg Test, Ur Positive (A) Negative    Assessment:   1. Possible pregnancy, not confirmed     Plan:   Pregnancy Test: Positive  Estimated Date of Delivery: 06/08/24 BP Cuff Measurement taken. Cuff Size Adult Small Encouraged well-balanced diet, plenty of rest when needed, pre-natal vitamins daily and walking for exercise.  Discussed self-help for nausea, avoiding OTC medications until consulting provider or pharmacist, other than Tylenol as needed, minimal caffeine (1-2 cups daily) and avoiding alcohol.   She will schedule her nurse visit @ 7-[redacted] wks pregnant, u/s for dating @10  wk, and NOB visit at [redacted] wk pregnant.    Feel free to call with any questions.     Loney Laurence, CMA

## 2023-10-30 ENCOUNTER — Telehealth: Payer: BC Managed Care – PPO

## 2023-10-30 VITALS — Wt 124.0 lb

## 2023-10-30 DIAGNOSIS — Z3689 Encounter for other specified antenatal screening: Secondary | ICD-10-CM | POA: Diagnosis not present

## 2023-10-30 DIAGNOSIS — Z3480 Encounter for supervision of other normal pregnancy, unspecified trimester: Secondary | ICD-10-CM | POA: Insufficient documentation

## 2023-10-30 DIAGNOSIS — Z3401 Encounter for supervision of normal first pregnancy, first trimester: Secondary | ICD-10-CM | POA: Insufficient documentation

## 2023-10-30 DIAGNOSIS — Z3490 Encounter for supervision of normal pregnancy, unspecified, unspecified trimester: Secondary | ICD-10-CM | POA: Insufficient documentation

## 2023-10-30 DIAGNOSIS — Z369 Encounter for antenatal screening, unspecified: Secondary | ICD-10-CM

## 2023-10-30 NOTE — Progress Notes (Signed)
New OB Intake  I connected with  Misty Garza on 10/30/23 at  3:15 PM EST by Video Visit and verified that I am speaking with the correct person using two identifiers. Nurse is located at [facility] and pt is located at home.  I explained I am completing New OB Intake today. We discussed her EDD of 06/08/24 that is based on LMP of 09/02/23. Pt is G1/P0. I reviewed her allergies, medications, Medical/Surgical/OB history, and appropriate screenings. There are no cats in the home.  Based on history, this is a/an pregnancy uncomplicated . Her obstetrical history is significant for  none .  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  Anatomy US Explained first scheduled [place] will be Jan 14th and an anatomy scan will be done at 20 weeks.  Labs Discussed genetic screening with patient. Patient desires genetic testing to be drawn at new OB visit. Discussed possible labs to be drawn at new OB appointment.  COVID Vaccine Patient has not had COVID vaccine.   Social Determinants of Health Food Insecurity: denies food insecurity Transportation: Patient denies transportation needs.  First visit review I reviewed new OB appt with pt. I explained she will have ob bloodwork and pap smear/pelvic exam if indicated. Explained pt will be seen by Dr. Lonny Prude at first visit; encounter routed to appropriate provider.   Loran Senters, [place] 10/30/2023  3:31 PM

## 2023-10-30 NOTE — Patient Instructions (Signed)
Second Trimester of Pregnancy  The second trimester of pregnancy is from week 13 through week 27. This is months 4 through 6 of pregnancy. The second trimester is often a time when you feel your best. Your body has adjusted to being pregnant, and you begin to feel better physically. During the second trimester: Morning sickness has lessened or stopped completely. You may have more energy. You may have an increase in appetite. The second trimester is also a time when the unborn baby (fetus) is growing rapidly. At the end of the sixth month, the fetus may be up to 12 inches long and weigh about 1 pounds. You will likely begin to feel the baby move (quickening) between 16 and 20 weeks of pregnancy. Body changes during your second trimester Your body continues to go through many changes during your second trimester. The changes vary and generally return to normal after the baby is born. Physical changes Your weight will continue to increase. You will notice your lower abdomen bulging out. You may begin to get stretch marks on your hips, abdomen, and breasts. Your breasts will continue to grow and to become tender. Dark spots or blotches (chloasma or mask of pregnancy) may develop on your face. A dark line from your belly button to the pubic area (linea nigra) may appear. You may have changes in your hair. These can include thickening of your hair, rapid growth, and changes in texture. Some people also have hair loss during or after pregnancy, or hair that feels dry or thin. Health changes You may develop headaches. You may have heartburn. You may develop constipation. You may develop hemorrhoids or swollen, bulging veins (varicose veins). Your gums may bleed and may be sensitive to brushing and flossing. You may urinate more often because the fetus is pressing on your bladder. You may have back pain. This is caused by: Weight gain. Pregnancy hormones that are relaxing the joints in your  pelvis. A shift in weight and the muscles that support your balance. Follow these instructions at home: Medicines Follow your health care provider's instructions regarding medicine use. Specific medicines may be either safe or unsafe to take during pregnancy. Do not take any medicines unless approved by your health care provider. Take a prenatal vitamin that contains at least 600 micrograms (mcg) of folic acid. Eating and drinking Eat a healthy diet that includes fresh fruits and vegetables, whole grains, good sources of protein such as meat, eggs, or tofu, and low-fat dairy products. Avoid raw meat and unpasteurized juice, milk, and cheese. These carry germs that can harm you and your baby. You may need to take these actions to prevent or treat constipation: Drink enough fluid to keep your urine pale yellow. Eat foods that are high in fiber, such as beans, whole grains, and fresh fruits and vegetables. Limit foods that are high in fat and processed sugars, such as fried or sweet foods. Activity Exercise only as directed by your health care provider. Most people can continue their usual exercise routine during pregnancy. Try to exercise for 30 minutes at least 5 days a week. Stop exercising if you develop contractions in your uterus. Stop exercising if you develop pain or cramping in the lower abdomen or lower back. Avoid exercising if it is very hot or humid or if you are at a high altitude. Avoid heavy lifting. If you choose to, you may have sex unless your health care provider tells you not to. Relieving pain and discomfort Wear a supportive   bra to prevent discomfort from breast tenderness. Take warm sitz baths to soothe any pain or discomfort caused by hemorrhoids. Use hemorrhoid cream if your health care provider approves. Rest with your legs raised (elevated) if you have leg cramps or low back pain. If you develop varicose veins: Wear support hose as told by your health care  provider. Elevate your feet for 15 minutes, 3-4 times a day. Limit salt in your diet. Safety Wear your seat belt at all times when driving or riding in a car. Talk with your health care provider if someone is verbally or physically abusive to you. Lifestyle Do not use hot tubs, steam rooms, or saunas. Do not douche. Do not use tampons or scented sanitary pads. Avoid cat litter boxes and soil used by cats. These carry germs that can cause birth defects in the baby and possibly loss of the fetus by miscarriage or stillbirth. Do not use herbal remedies, alcohol, illegal drugs, or medicines that are not approved by your health care provider. Chemicals in these products can harm your baby. Do not use any products that contain nicotine or tobacco, such as cigarettes, e-cigarettes, and chewing tobacco. If you need help quitting, ask your health care provider. General instructions During a routine prenatal visit, your health care provider will do a physical exam and other tests. He or she will also discuss your overall health. Keep all follow-up visits. This is important. Ask your health care provider for a referral to a local prenatal education class. Ask for help if you have counseling or nutritional needs during pregnancy. Your health care provider can offer advice or refer you to specialists for help with various needs. Where to find more information American Pregnancy Association: americanpregnancy.org American College of Obstetricians and Gynecologists: acog.org/en/Womens%20Health/Pregnancy Office on Women's Health: womenshealth.gov/pregnancy Contact a health care provider if you have: A headache that does not go away when you take medicine. Vision changes or you see spots in front of your eyes. Mild pelvic cramps, pelvic pressure, or nagging pain in the abdominal area. Persistent nausea, vomiting, or diarrhea. A bad-smelling vaginal discharge or foul-smelling urine. Pain when you  urinate. Sudden or extreme swelling of your face, hands, ankles, feet, or legs. A fever. Get help right away if you: Have fluid leaking from your vagina. Have spotting or bleeding from your vagina. Have severe abdominal cramping or pain. Have difficulty breathing. Have chest pain. Have fainting spells. Have not felt your baby move for the time period told by your health care provider. Have new or increased pain, swelling, or redness in an arm or leg. Summary The second trimester of pregnancy is from week 13 through week 27 (months 4 through 6). Do not use herbal remedies, alcohol, illegal drugs, or medicines that are not approved by your health care provider. Chemicals in these products can harm your baby. Exercise only as directed by your health care provider. Most people can continue their usual exercise routine during pregnancy. Keep all follow-up visits. This is important. This information is not intended to replace advice given to you by your health care provider. Make sure you discuss any questions you have with your health care provider. Document Revised: 03/25/2020 Document Reviewed: 01/30/2020 Elsevier Patient Education  2024 [employer] First Trimester of Pregnancy  The first trimester of pregnancy starts on the first day of your last menstrual period until the end of week 12. This is also called months 1 through 3 of pregnancy. Body changes during your first trimester Your   body goes through many changes during pregnancy. The changes usually return to normal after your baby is born. Physical changes You may gain or lose weight. Your breasts may grow larger and hurt. The area around your nipples may get darker. Dark spots or blotches may develop on your face. You may have changes in your hair. Health changes You may feel like you might vomit (nauseous), and you may vomit. You may have heartburn. You may have headaches. You may have trouble pooping (constipation). Your  gums may bleed. Other changes You may get tired easily. You may pee (urinate) more often. Your menstrual periods will stop. You may not feel hungry. You may want to eat certain kinds of food. You may have changes in your emotions from day to day. You may have more dreams. Follow these instructions at home: Medicines Take over-the-counter and prescription medicines only as told by your doctor. Some medicines are not safe during pregnancy. Take a prenatal vitamin that contains at least 600 micrograms (mcg) of folic acid. Eating and drinking Eat healthy meals that include: Fresh fruits and vegetables. Whole grains. Good sources of protein, such as meat, eggs, or tofu. Low-fat dairy products. Avoid raw meat and unpasteurized juice, milk, and cheese. If you feel like you may vomit, or you vomit: Eat 4 or 5 small meals a day instead of 3 large meals. Try eating a few soda crackers. Drink liquids between meals instead of during meals. You may need to take these actions to prevent or treat trouble pooping: Drink enough fluids to keep your pee (urine) pale yellow. Eat foods that are high in fiber. These include beans, whole grains, and fresh fruits and vegetables. Limit foods that are high in fat and sugar. These include fried or sweet foods. Activity Exercise only as told by your doctor. Most people can do their usual exercise routine during pregnancy. Stop exercising if you have cramps or pain in your lower belly (abdomen) or low back. Do not exercise if it is too hot or too humid, or if you are in a place of great height (high altitude). Avoid heavy lifting. If you choose to, you may have sex unless your doctor tells you not to. Relieving pain and discomfort Wear a good support bra if your breasts are sore. Rest with your legs raised (elevated) if you have leg cramps or low back pain. If you have bulging veins (varicose veins) in your legs: Wear support hose as told by your  doctor. Raise your feet for 15 minutes, 3-4 times a day. Limit salt in your food. Safety Wear your seat belt at all times when you are in a car. Talk with your doctor if someone is hurting you or yelling at you. Talk with your doctor if you are feeling sad or have thoughts of hurting yourself. Lifestyle Do not use hot tubs, steam rooms, or saunas. Do not douche. Do not use tampons or scented sanitary pads. Do not use herbal medicines, illegal drugs, or medicines that are not approved by your doctor. Do not drink alcohol. Do not smoke or use any products that contain nicotine or tobacco. If you need help quitting, ask your doctor. Avoid cat litter boxes and soil that is used by cats. These carry germs that can cause harm to the baby and can cause a loss of your baby by miscarriage or stillbirth. General instructions Keep all follow-up visits. This is important. Ask for help if you need counseling or if you need help with   nutrition. Your doctor can give you advice or tell you where to go for help. Visit your dentist. At home, brush your teeth with a soft toothbrush. Floss gently. Write down your questions. Take them to your prenatal visits. Where to find more information American Pregnancy Association: americanpregnancy.org American College of Obstetricians and Gynecologists: www.acog.org Office on Women's Health: womenshealth.gov/pregnancy Contact a doctor if: You are dizzy. You have a fever. You have mild cramps or pressure in your lower belly. You have a nagging pain in your belly area. You continue to feel like you may vomit, you vomit, or you have watery poop (diarrhea) for 24 hours or longer. You have a bad-smelling fluid coming from your vagina. You have pain when you pee. You are exposed to a disease that spreads from person to person, such as chickenpox, measles, Zika virus, HIV, or hepatitis. Get help right away if: You have spotting or bleeding from your vagina. You have  very bad belly cramping or pain. You have shortness of breath or chest pain. You have any kind of injury, such as from a fall or a car crash. You have new or increased pain, swelling, or redness in an arm or leg. Summary The first trimester of pregnancy starts on the first day of your last menstrual period until the end of week 12 (months 1 through 3). Eat 4 or 5 small meals a day instead of 3 large meals. Do not smoke or use any products that contain nicotine or tobacco. If you need help quitting, ask your doctor. Keep all follow-up visits. This information is not intended to replace advice given to you by your health care provider. Make sure you discuss any questions you have with your health care provider. Document Revised: 03/25/2020 Document Reviewed: 01/30/2020 Elsevier Patient Education  2024 [employer] Commonly Asked Questions During Pregnancy  Cats: A parasite can be excreted in cat feces.  To avoid exposure you need to have another person empty the little box.  If you must empty the litter box you will need to wear gloves.  Wash your hands after handling your cat.  This parasite can also be found in raw or undercooked meat so this should also be avoided.  Colds, Sore Throats, Flu: Please check your medication sheet to see what you can take for symptoms.  If your symptoms are unrelieved by these medications please call the office.  Dental Work: Most any dental work your dentist recommends is permitted.  X-rays should only be taken during the first trimester if absolutely necessary.  Your abdomen should be shielded with a lead apron during all x-rays.  Please notify your provider prior to receiving any x-rays.  Novocaine is fine; gas is not recommended.  If your dentist requires a note from us prior to dental work please call the office and we will provide one for you.  Exercise: Exercise is an important part of staying healthy during your pregnancy.  You may continue most exercises  you were accustomed to prior to pregnancy.  Later in your pregnancy you will most likely notice you have difficulty with activities requiring balance like riding a bicycle.  It is important that you listen to your body and avoid activities that put you at a higher risk of falling.  Adequate rest and staying well hydrated are a must!  If you have questions about the safety of specific activities ask your provider.    Exposure to Children with illness: Try to avoid obvious exposure; report any   symptoms to us when noted,  If you have chicken pos, red measles or mumps, you should be immune to these diseases.   Please do not take any vaccines while pregnant unless you have checked with your OB provider.  Fetal Movement: After 28 weeks we recommend you do "kick counts" twice daily.  Lie or sit down in a calm quiet environment and count your baby movements "kicks".  You should feel your baby at least 10 times per hour.  If you have not felt 10 kicks within the first hour get up, walk around and have something sweet to eat or drink then repeat for an additional hour.  If count remains less than 10 per hour notify your provider.  Fumigating: Follow your pest control agent's advice as to how long to stay out of your home.  Ventilate the area well before re-entering.  Hemorrhoids:   Most over-the-counter preparations can be used during pregnancy.  Check your medication to see what is safe to use.  It is important to use a stool softener or fiber in your diet and to drink lots of liquids.  If hemorrhoids seem to be getting worse please call the office.   Hot Tubs:  Hot tubs Jacuzzis and saunas are not recommended while pregnant.  These increase your internal body temperature and should be avoided.  Intercourse:  Sexual intercourse is safe during pregnancy as long as you are comfortable, unless otherwise advised by your provider.  Spotting may occur after intercourse; report any bright red bleeding that is heavier  than spotting.  Labor:  If you know that you are in labor, please go to the hospital.  If you are unsure, please call the office and let us help you decide what to do.  Lifting, straining, etc:  If your job requires heavy lifting or straining please check with your provider for any limitations.  Generally, you should not lift items heavier than that you can lift simply with your hands and arms (no back muscles)  Painting:  Paint fumes do not harm your pregnancy, but may make you ill and should be avoided if possible.  Latex or water based paints have less odor than oils.  Use adequate ventilation while painting.  Permanents & Hair Color:  Chemicals in hair dyes are not recommended as they cause increase hair dryness which can increase hair loss during pregnancy.  " Highlighting" and permanents are allowed.  Dye may be absorbed differently and permanents may not hold as well during pregnancy.  Sunbathing:  Use a sunscreen, as skin burns easily during pregnancy.  Drink plenty of fluids; avoid over heating.  Tanning Beds:  Because their possible side effects are still unknown, tanning beds are not recommended.  Ultrasound Scans:  Routine ultrasounds are performed at approximately 20 weeks.  You will be able to see your baby's general anatomy an if you would like to know the gender this can usually be determined as well.  If it is questionable when you conceived you may also receive an ultrasound early in your pregnancy for dating purposes.  Otherwise ultrasound exams are not routinely performed unless there is a medical necessity.  Although you can request a scan we ask that you pay for it when conducted because insurance does not cover " patient request" scans.  Work: If your pregnancy proceeds without complications you may work until your due date, unless your physician or employer advises otherwise.  Round Ligament Pain/Pelvic Discomfort:  Sharp, shooting pains not associated   with bleeding are  fairly common, usually occurring in the second trimester of pregnancy.  They tend to be worse when standing up or when you remain standing for long periods of time.  These are the result of pressure of certain pelvic ligaments called "round ligaments".  Rest, Tylenol and heat seem to be the most effective relief.  As the womb and fetus grow, they rise out of the pelvis and the discomfort improves.  Please notify the office if your pain seems different than that described.  It may represent a more serious condition.  Common Medications Safe in Pregnancy  Acne:      Constipation:  Benzoyl Peroxide     Colace  Clindamycin      Dulcolax Suppository  Topica Erythromycin     Fibercon  Salicylic Acid      Metamucil         Miralax AVOID:        Senakot   Accutane    Cough:  Retin-A       Cough Drops  Tetracycline      Phenergan w/ Codeine if Rx  Minocycline      Robitussin (Plain & DM)  Antibiotics:     Crabs/Lice:  Ceclor       RID  Cephalosporins    AVOID:  E-Mycins      Kwell  Keflex  Macrobid/Macrodantin   Diarrhea:  Penicillin      Kao-Pectate  Zithromax      Imodium AD         PUSH FLUIDS AVOID:       Cipro     Fever:  Tetracycline      Tylenol (Regular or Extra  Minocycline       Strength)  Levaquin      Extra Strength-Do not          Exceed 8 tabs/24 hrs Caffeine:        <200mg/day (equiv. To 1 cup of coffee or  approx. 3 12 oz sodas)         Gas: Cold/Hayfever:       Gas-X  Benadryl      Mylicon  Claritin       Phazyme  **Claritin-D        Chlor-Trimeton    Headaches:  Dimetapp      ASA-Free Excedrin  Drixoral-Non-Drowsy     Cold Compress  Mucinex (Guaifenasin)     Tylenol (Regular or Extra  Sudafed/Sudafed-12 Hour     Strength)  **Sudafed PE Pseudoephedrine   Tylenol Cold & Sinus     Vicks Vapor Rub  Zyrtec  **AVOID if Problems With Blood Pressure         Heartburn: Avoid lying down for at least 1 hour after meals  Aciphex      Maalox     Rash:  Milk of  Magnesia     Benadryl    Mylanta       1% Hydrocortisone Cream  Pepcid  Pepcid Complete   Sleep Aids:  Prevacid      Ambien   Prilosec       Benadryl  Rolaids       Chamomile Tea  Tums (Limit 4/day)     Unisom         Tylenol PM         Warm milk-add vanilla or  Hemorrhoids:       Sugar for taste  Anusol/Anusol H.C.  (RX: Analapram 2.5%)  Sugar Substitutes:    Hydrocortisone OTC     Ok in moderation  Preparation H      Tucks        Vaseline lotion applied to tissue with wiping    Herpes:     Throat:  Acyclovir      Oragel  Famvir  Valtrex     Vaccines:         Flu Shot Leg Cramps:       *Gardasil  Benadryl      Hepatitis A         Hepatitis B Nasal Spray:       Pneumovax  Saline Nasal Spray     Polio Booster         Tetanus Nausea:       Tuberculosis test or PPD  Vitamin B6 25 mg TID   AVOID:    Dramamine      *Gardasil  Emetrol       Live Poliovirus  Ginger Root 250 mg QID    MMR (measles, mumps &  High Complex Carbs @ Bedtime    rebella)  Sea Bands-Accupressure    Varicella (Chickenpox)  Unisom 1/2 tab TID     *No known complications           If received before Pain:         Known pregnancy;   Darvocet       Resume series after  Lortab        Delivery  Percocet    Yeast:   Tramadol      Femstat  Tylenol 3      Gyne-lotrimin  Ultram       Monistat  Vicodin           MISC:         All Sunscreens           Hair Coloring/highlights          Insect Repellant's          (Including DEET)         Mystic Tans  

## 2023-11-01 NOTE — L&D Delivery Note (Signed)
        Delivery Note   Misty Garza is a 23 y.o. G1P0 at [redacted]w[redacted]d Estimated Date of Delivery: 06/01/24  PRE-OPERATIVE DIAGNOSIS:  1) [redacted]w[redacted]d pregnancy.    POST-OPERATIVE DIAGNOSIS:  1) [redacted]w[redacted]d pregnancy s/p Vaginal, Spontaneous   Delivery Type: Vaginal, Spontaneous   Delivery Anesthesia: Epidural  Labor Complications:  none    ESTIMATED BLOOD LOSS: 300 ml    FINDINGS:   1) female infant, Apgar scores of 9   at 1 minute and 9   at 5 minutes and a birthweight pending , infant skin to skin.   2) Nuchal cord: no  SPECIMENS:   PLACENTA:   Appearance: Intact, 3 vessel cord   Removal: Spontaneous     Disposition:  per protocol   DISPOSITION:  Infant to left in stable condition in the delivery room, with L&D personnel and mother,  NARRATIVE SUMMARY: Labor course:  Ms. Misty Garza is a G1P0 at [redacted]w[redacted]d who presented for induction of labor.  She progressed well in labor with pitocin .  She received the appropriate epidural anesthesia and proceeded to complete dilation. She evidenced good maternal expulsive effort during the second stage. She went on to deliver a viable female infant. The placenta delivered without problems and was noted to be complete. A perineal and vaginal examination was performed. Episiotomy/Lacerations: 2nd degree;Vaginal The  lacerations was repaired with 3-0 Vicryl Rapide suture.   Zelda Hummer, CNM  06/07/2024 9:22 PM

## 2023-11-13 ENCOUNTER — Other Ambulatory Visit: Payer: Self-pay | Admitting: Obstetrics

## 2023-11-13 DIAGNOSIS — O3680X0 Pregnancy with inconclusive fetal viability, not applicable or unspecified: Secondary | ICD-10-CM

## 2023-11-13 DIAGNOSIS — Z3480 Encounter for supervision of other normal pregnancy, unspecified trimester: Secondary | ICD-10-CM

## 2023-11-13 DIAGNOSIS — Z369 Encounter for antenatal screening, unspecified: Secondary | ICD-10-CM

## 2023-11-14 ENCOUNTER — Ambulatory Visit (INDEPENDENT_AMBULATORY_CARE_PROVIDER_SITE_OTHER): Payer: BC Managed Care – PPO

## 2023-11-14 DIAGNOSIS — Z3A11 11 weeks gestation of pregnancy: Secondary | ICD-10-CM

## 2023-11-14 DIAGNOSIS — O3680X0 Pregnancy with inconclusive fetal viability, not applicable or unspecified: Secondary | ICD-10-CM | POA: Diagnosis not present

## 2023-11-14 DIAGNOSIS — Z369 Encounter for antenatal screening, unspecified: Secondary | ICD-10-CM | POA: Diagnosis not present

## 2023-11-28 ENCOUNTER — Encounter: Payer: Self-pay | Admitting: Obstetrics

## 2023-11-28 ENCOUNTER — Other Ambulatory Visit (HOSPITAL_COMMUNITY)
Admission: RE | Admit: 2023-11-28 | Discharge: 2023-11-28 | Disposition: A | Discharge status: Home / Self Care | Payer: BC Managed Care – PPO | Source: Ambulatory Visit | Attending: Obstetrics | Admitting: Obstetrics

## 2023-11-28 ENCOUNTER — Ambulatory Visit (INDEPENDENT_AMBULATORY_CARE_PROVIDER_SITE_OTHER): Payer: BC Managed Care – PPO | Admitting: Obstetrics

## 2023-11-28 VITALS — BP 114/69 | HR 80 | Ht 67.0 in | Wt 131.0 lb

## 2023-11-28 DIAGNOSIS — Z131 Encounter for screening for diabetes mellitus: Secondary | ICD-10-CM | POA: Insufficient documentation

## 2023-11-28 DIAGNOSIS — Z3A13 13 weeks gestation of pregnancy: Secondary | ICD-10-CM

## 2023-11-28 DIAGNOSIS — Z1379 Encounter for other screening for genetic and chromosomal anomalies: Secondary | ICD-10-CM | POA: Insufficient documentation

## 2023-11-28 DIAGNOSIS — Z0184 Encounter for antibody response examination: Secondary | ICD-10-CM

## 2023-11-28 DIAGNOSIS — Z0283 Encounter for blood-alcohol and blood-drug test: Secondary | ICD-10-CM | POA: Diagnosis not present

## 2023-11-28 DIAGNOSIS — D649 Anemia, unspecified: Secondary | ICD-10-CM | POA: Diagnosis not present

## 2023-11-28 DIAGNOSIS — Z3491 Encounter for supervision of normal pregnancy, unspecified, first trimester: Secondary | ICD-10-CM | POA: Diagnosis not present

## 2023-11-28 DIAGNOSIS — Z3A12 12 weeks gestation of pregnancy: Secondary | ICD-10-CM | POA: Diagnosis not present

## 2023-11-28 DIAGNOSIS — Z113 Encounter for screening for infections with a predominantly sexual mode of transmission: Secondary | ICD-10-CM

## 2023-11-28 NOTE — Progress Notes (Signed)
OBSTETRIC INITIAL PRENATAL VISIT  Subjective:    Misty Garza is being seen today for her first obstetrical visit.  This is not a planned pregnancy. She is a 23 y.o. G1P0 female at [redacted]w[redacted]d gestation, Estimated Date of Delivery: 06/01/24 by [redacted]w[redacted]d [place], not consistent with her unsure LMP. Her obstetrical history is unremarkable as is her PMH. Relationship with FOB: significant other, not living together. Patient does intend to breast feed. Pregnancy history fully reviewed.   OB History  Gravida Para Term Preterm AB Living  1 0 0 0 0 0  SAB IAB Ectopic Multiple Live Births  0 0 0 0 0    # Outcome Date GA Lbr Len/2nd Weight Sex Type Anes PTL Lv  1 Current            Gynecologic History:  Last pap smear was 11/15/22.  Results were Normal.   Denies h/o abnormal pap smears in the past.  STI Hx: +chlamydia, treated Contraception prior to conception: none  Past Medical History:  Diagnosis Date   BV (bacterial vaginosis)    Chlamydia    Dysmenorrhea 11/15/2022  Denies hx of asthma, thyroid disease, HTN, DM, kidney problems, bleeding/clotting disorders.   Family History  Problem Relation Age of Onset   Healthy Mother    Healthy Father    Healthy Brother    Cancer Maternal Grandmother 52       lung   Diabetes Maternal Grandfather    Cancer Maternal Grandfather 79       lung   COPD Maternal Grandfather    Kidney disease Maternal Grandfather        stage 4   Heart Problems Maternal Grandfather    Cancer Paternal Grandmother        lung  2022   Cancer Paternal Grandfather        stomach   Heart attack Paternal Grandfather   Denies FMH of preeclampsia, gHTN or GDM.   Past Surgical History:  Procedure Laterality Date   tubs in ears  10/31/2000   WISDOM TOOTH EXTRACTION  2018   four    Social History   Socioeconomic History   Marital status: Single    Spouse name: Not on file   Number of children: 0   Years of education: 16   Highest education level: Not on file   Occupational History   Occupation: [employer]  Tobacco Use   Smoking status: Never   Smokeless tobacco: Never  Vaping Use   Vaping status: Former  Substance and Sexual Activity   Alcohol use: No   Drug use: No   Sexual activity: Yes    Partners: Male    Birth control/protection: None  Other Topics Concern   Not on file  Social History Narrative   Not on file   Social Drivers of Health   Financial Resource Strain: Low Risk  (10/30/2023)   Overall Financial Resource Strain (CARDIA)    Difficulty of Paying Living Expenses: Not hard at all  Food Insecurity: No Food Insecurity (10/30/2023)   Hunger Vital Sign    Worried About Running Out of Food in the Last Year: Never true    Ran Out of Food in the Last Year: Never true  Transportation Needs: No Transportation Needs (10/30/2023)   PRAPARE - Administrator, Civil Service (Medical): No    Lack of Transportation (Non-Medical): No  Physical Activity: Inactive (10/30/2023)   Exercise Vital Sign    Days of Exercise per Week:  0 days    Minutes of Exercise per Session: 0 min  Stress: No Stress Concern Present (10/30/2023)   [employer] of Occupational Health - Occupational Stress Questionnaire    Feeling of Stress : Not at all  Social Connections: Unknown (10/30/2023)   Social Connection and Isolation Panel [NHANES]    Frequency of Communication with Friends and Family: More than three times a week    Frequency of Social Gatherings with Friends and Family: More than three times a week    Attends Religious Services: More than 4 times per year    Active Member of [employer] or Organizations: No    Attends Banker Meetings: Never    Marital Status: Not on file  Intimate Partner Violence: Not At Risk (10/30/2023)   Humiliation, Afraid, Rape, and Kick questionnaire    Fear of Current or Ex-Partner: No    Emotionally Abused: No    Physically Abused: No    Sexually Abused: No    Current Outpatient  Medications on File Prior to Visit  Medication Sig Dispense Refill   Prenatal Vit-Fe Fumarate-FA (MULTIVITAMIN-PRENATAL) 27-0.8 MG TABS tablet Take 1 tablet by mouth daily at 12 noon.     No current facility-administered medications on file prior to visit.    Allergies  Allergen Reactions   Amoxicillin Hives   Review of Systems General: Not Present- Fever, Weight Loss and Weight Gain. Skin: Not Present- Rash. HEENT: Not Present- Blurred Vision, Headache and Bleeding Gums. Respiratory: Not Present- Difficulty Breathing. Breast: Not Present- Breast Mass. Cardiovascular: Not Present- Chest Pain, Elevated Blood Pressure, Fainting / Blacking Out and Shortness of Breath. Gastrointestinal: Not Present- Abdominal Pain, Constipation, Nausea and Vomiting. Female Genitourinary: Not Present- Frequency, Painful Urination, Pelvic Pain, Vaginal Bleeding, Vaginal Discharge, Contractions, regular, Fetal Movements Decreased, Urinary Complaints and Vaginal Fluid. Musculoskeletal: Not Present- Back Pain and Leg Cramps. Neurological: Not Present- Dizziness. Psychiatric: Not Present- Depression.     Objective:   Blood pressure 114/69, pulse 80, weight 131 lb (59.4 kg), last menstrual period 09/02/2023.  Body mass index is 20.52 kg/m.  General Appearance:    Alert, cooperative, no distress, appears stated age  Head:    Normocephalic, without obvious abnormality, atraumatic  Eyes:    PERRL, conjunctiva/corneas clear, EOM's intact, both eyes  Ears:    Normal external ear canals, both ears  Nose:   Nares normal, septum midline, mucosa normal, no drainage or sinus tenderness  Throat:   Lips, mucosa, and tongue normal; teeth and gums normal  Neck:   Supple, symmetrical, trachea midline, no adenopathy; thyroid: no enlargement/tenderness/nodules; no carotid bruit or JVD  Back:     Symmetric, no curvature, ROM normal, no CVA tenderness  Lungs:     Clear to auscultation bilaterally, respirations unlabored   Chest Wall:    No tenderness or deformity   Heart:    Regular rate and rhythm, S1 and S2 normal, no murmur, rub or gallop  Breast Exam:    No tenderness, masses, or nipple abnormality  Abdomen:     Soft, non-tender, bowel sounds active all four quadrants, no masses, no organomegaly.  FHT 158  bpm.  Genitalia:    Pelvic:external genitalia normal, vagina without lesions, discharge, or tenderness, rectovaginal septum  normal. Cervix normal in appearance, no cervical motion tenderness, no adnexal masses or tenderness.  Pregnancy positive findings: uterine enlargement: 13 wk size, nontender.   Rectal:    Normal external sphincter.  No hemorrhoids appreciated. Internal exam not done.  Extremities:   Extremities normal, atraumatic, no cyanosis or edema  Pulses:   2+ and symmetric all extremities  Skin:   Skin color, texture, turgor normal, no rashes or lesions  Lymph nodes:   Cervical, supraclavicular, and axillary nodes normal  Neurologic:   CNII-XII intact, normal strength, sensation and reflexes throughout     Assessment:   1. Initial obstetric visit in first trimester   2. Encounter for drug screening   3. Encounter for screening for diabetes mellitus   4. Genetic screening   5. Immunity status testing   6. Screening for STDs (sexually transmitted diseases)     Plan:   Supervision of normal first pregnancy  - EDD is 06/01/24 based on [redacted]w[redacted]d [place]; pt's LMP is unsure. - Initial labs reviewed and ordered. - Prenatal vitamins encouraged. - Problem list reviewed and updated. - New OB counseling:  The patient has been given an overview regarding routine prenatal care.  Recommendations regarding diet, weight gain, and exercise in pregnancy were given. - Prenatal testing, optional genetic testing, and ultrasound use in pregnancy were reviewed.  Traditional genetic screening vs cell-fee DNA genetic screening discussed, including risks and benefits. Testing undecided. Information re: [id number] and  [employer] provided for pt to explore financial information.  Follow up in 4 weeks.   Julieanne Manson, DO [facility]

## 2023-11-29 LAB — URINALYSIS, ROUTINE W REFLEX MICROSCOPIC
Bilirubin, UA: NEGATIVE
Glucose, UA: NEGATIVE
Leukocytes,UA: NEGATIVE
Nitrite, UA: NEGATIVE
RBC, UA: NEGATIVE
Specific Gravity, UA: 1.027 (ref 1.005–1.030)
Urobilinogen, Ur: 0.2 mg/dL (ref 0.2–1.0)
pH, UA: 5.5 (ref 5.0–7.5)

## 2023-11-29 LAB — CBC/D/PLT+RPR+RH+ABO+RUBIGG...
Antibody Screen: NEGATIVE
Basophils Absolute: 0 10*3/uL (ref 0.0–0.2)
Basos: 0 %
EOS (ABSOLUTE): 0 10*3/uL (ref 0.0–0.4)
Eos: 0 %
HCV Ab: NONREACTIVE
HIV Screen 4th Generation wRfx: NONREACTIVE
Hematocrit: 36.1 % (ref 34.0–46.6)
Hemoglobin: 12.4 g/dL (ref 11.1–15.9)
Hepatitis B Surface Ag: NEGATIVE
Immature Grans (Abs): 0.1 10*3/uL (ref 0.0–0.1)
Immature Granulocytes: 1 %
Lymphocytes Absolute: 1.6 10*3/uL (ref 0.7–3.1)
Lymphs: 14 %
MCH: 30.8 pg (ref 26.6–33.0)
MCHC: 34.3 g/dL (ref 31.5–35.7)
MCV: 90 fL (ref 79–97)
Monocytes Absolute: 0.8 10*3/uL (ref 0.1–0.9)
Monocytes: 7 %
Neutrophils Absolute: 9.3 10*3/uL — ABNORMAL HIGH (ref 1.4–7.0)
Neutrophils: 78 %
Platelets: 371 10*3/uL (ref 150–450)
RBC: 4.03 x10E6/uL (ref 3.77–5.28)
RDW: 12.5 % (ref 11.7–15.4)
RPR Ser Ql: NONREACTIVE
Rh Factor: POSITIVE
Rubella Antibodies, IGG: 1.67 {index} (ref >0.99)
Varicella zoster IgG: REACTIVE
WBC: 11.7 10*3/uL — ABNORMAL HIGH (ref 3.4–10.8)

## 2023-11-29 LAB — HCV INTERPRETATION

## 2023-11-30 LAB — MONITOR DRUG PROFILE 14(MW)
Amphetamine Scrn, Ur: NEGATIVE ng/mL
BARBITURATE SCREEN URINE: NEGATIVE ng/mL
BENZODIAZEPINE SCREEN, URINE: NEGATIVE ng/mL
Buprenorphine, Urine: NEGATIVE ng/mL
CANNABINOIDS UR QL SCN: NEGATIVE ng/mL
Cocaine (Metab) Scrn, Ur: NEGATIVE ng/mL
Creatinine(Crt), U: 133.2 mg/dL (ref 20.0–300.0)
Fentanyl, Urine: NEGATIVE pg/mL
Meperidine Screen, Urine: NEGATIVE ng/mL
Methadone Screen, Urine: NEGATIVE ng/mL
OXYCODONE+OXYMORPHONE UR QL SCN: NEGATIVE ng/mL
Opiate Scrn, Ur: NEGATIVE ng/mL
Ph of Urine: 5.3 (ref 4.5–8.9)
Phencyclidine Qn, Ur: NEGATIVE ng/mL
Propoxyphene Scrn, Ur: NEGATIVE ng/mL
SPECIFIC GRAVITY: 1.024
Tramadol Screen, Urine: NEGATIVE ng/mL

## 2023-11-30 LAB — CERVICOVAGINAL ANCILLARY ONLY
Chlamydia: NEGATIVE
Comment: NEGATIVE
Comment: NEGATIVE
Comment: NORMAL
Neisseria Gonorrhea: NEGATIVE
Trichomonas: NEGATIVE

## 2023-11-30 LAB — NICOTINE SCREEN, URINE: Cotinine Ql Scrn, Ur: NEGATIVE ng/mL

## 2023-11-30 LAB — CULTURE, OB URINE

## 2023-11-30 LAB — URINE CULTURE, OB REFLEX: Organism ID, Bacteria: NO GROWTH

## 2023-12-26 ENCOUNTER — Ambulatory Visit (INDEPENDENT_AMBULATORY_CARE_PROVIDER_SITE_OTHER): Payer: BC Managed Care – PPO | Admitting: Obstetrics

## 2023-12-26 VITALS — BP 107/71 | HR 95 | Wt 137.0 lb

## 2023-12-26 DIAGNOSIS — Z3689 Encounter for other specified antenatal screening: Secondary | ICD-10-CM | POA: Diagnosis not present

## 2023-12-26 DIAGNOSIS — Z3401 Encounter for supervision of normal first pregnancy, first trimester: Secondary | ICD-10-CM | POA: Diagnosis not present

## 2023-12-26 NOTE — Progress Notes (Signed)
    Return Prenatal Note   Subjective  23 y.o. G1P0 at [redacted]w[redacted]d presents for this follow-up prenatal visit. Pregnancy notable for ADHD, unmedicated.   Patient is well today, thinks she felt some flutters but they went away. Has billing questions today about labs and is interested in quad/AFP, but wants to hold first and speak with her insurance.   Patient reports: Movement: Present Contractions: Not present Denies vaginal bleeding or leaking fluid. Objective  Flow sheet Vitals: Pulse Rate: 95 BP: 107/71 Fetal Heart Rate (bpm): 147 Total weight gain: 17 lb (7.711 kg)  General Appearance  No acute distress, well appearing, and well nourished Pulmonary   Normal work of breathing Neurologic   Alert and oriented to person, place, and time Psychiatric   Mood and affect within normal limits  Assessment/Plan   Plan  23 y.o. G1P0 at [redacted]w[redacted]d by 11wk [place] presents for follow-up OB visit. Reviewed prenatal record including previous visit note. 1. Encounter for supervision of normal first pregnancy in first trimester (Primary) -Reviewed NOB lab results with pt; declined NIPT due to cost.  -Recommend quad screen and/or AFP only, discussed screening recommendation; pt to check with insurance first; if decides she wants, understands should come for lab draw ideally prior to 20wks.  -Anatomy US ordered for 20wks  Return in about 3 weeks (around 01/16/2024) for ROB w/anatomy US.   Future Appointments  Date Time Provider Department Center  01/19/2024  8:15 AM [facility] [place] 1 AOB-[facility] None  01/19/2024  9:35 AM Tresea Mall, CNM [facility]OB-[facility] None   For next visit:  continue with routine prenatal care     Julieanne Manson, DO [facility]

## 2024-01-19 ENCOUNTER — Ambulatory Visit: Payer: BC Managed Care – PPO

## 2024-01-19 ENCOUNTER — Ambulatory Visit (INDEPENDENT_AMBULATORY_CARE_PROVIDER_SITE_OTHER): Payer: BC Managed Care – PPO | Admitting: Advanced Practice Midwife

## 2024-01-19 ENCOUNTER — Encounter: Payer: Self-pay | Admitting: Advanced Practice Midwife

## 2024-01-19 VITALS — BP 110/60 | HR 84 | Wt 145.0 lb

## 2024-01-19 DIAGNOSIS — Z3402 Encounter for supervision of normal first pregnancy, second trimester: Secondary | ICD-10-CM

## 2024-01-19 DIAGNOSIS — Z3A20 20 weeks gestation of pregnancy: Secondary | ICD-10-CM | POA: Diagnosis not present

## 2024-01-19 DIAGNOSIS — Z3480 Encounter for supervision of other normal pregnancy, unspecified trimester: Secondary | ICD-10-CM

## 2024-01-19 DIAGNOSIS — Z3689 Encounter for other specified antenatal screening: Secondary | ICD-10-CM

## 2024-01-19 NOTE — Progress Notes (Signed)
 Routine Prenatal Care Visit  Subjective  Misty Garza is a 23 y.o. G1P0 at [redacted]w[redacted]d being seen today for ongoing prenatal care.  She is currently monitored for the following issues for this low-risk pregnancy and has Skin cyst; ADHD (attention deficit hyperactivity disorder), inattentive type; and Supervision of normal pregnancy on their problem list.  ----------------------------------------------------------------------------------- Patient reports she is doing well. Anatomy scan today appears complete, normal, placenta posterior, gender surprise.   Contractions: Not present. Vag. Bleeding: None.  Movement: Present. Leaking Fluid denies.  ----------------------------------------------------------------------------------- The following portions of the patient's history were reviewed and updated as appropriate: allergies, current medications, past family history, past medical history, past social history, past surgical history and problem list. Problem list updated.  Objective  Blood pressure 110/60, pulse 84, weight 145 lb (65.8 kg), last menstrual period 09/02/2023. Pregravid weight 124 lb (56.2 kg) Total Weight Gain 21 lb (9.526 kg) Urinalysis: Urine Protein    Urine Glucose    Fetal Status: Fetal Heart Rate (bpm): 150 Fundal Height: 21 cm Movement: Present  Presentation: Vertex  General:  Alert, oriented and cooperative. Patient is in no acute distress.  Skin: Skin is warm and dry. No rash noted.   Cardiovascular: Normal heart rate noted  Respiratory: Normal respiratory effort, no problems with respiration noted  Abdomen: Soft, gravid, appropriate for gestational age. Pain/Pressure: Absent     Pelvic:  Cervical exam deferred        Extremities: Normal range of motion.  Edema: None  Mental Status: Normal mood and affect. Normal behavior. Normal judgment and thought content.   Assessment   23 y.o. G1P0 at [redacted]w[redacted]d by  06/01/2024, by Ultrasound presenting for routine prenatal visit  Plan    first Problems (from 10/30/23 to present)     Problem Noted Diagnosed Resolved   Supervision of normal pregnancy 10/30/2023 by Loran Senters, CMA  No   Overview Addendum 10/30/2023  3:30 PM by Loran Senters, CMA        Clinical Staff Provider  Office Location  [facility] Dating  By 11 wk u/s  Language  English Anatomy US  Complete, normal, placenta posterior, gender surprise  Flu Vaccine  offer Genetic Screen  NIPS: not done  TDaP vaccine   offer Hgb A1C or  GTT Early : NA Third trimester :   Covid declined   LAB RESULTS   Rhogam  A/Positive/-- (01/28 1523)  Blood Type A/Positive/-- (01/28 1523)   RSV offer Antibody Negative (01/28 1523)  Feeding Plan breast Rubella 1.67 (01/28 1523)  Contraception undecided RPR Non Reactive (01/28 1523)   Circumcision Yes HBsAg Negative (01/28 1523)   Pediatrician  undecided HIV Non Reactive (01/28 1523)  Support Person Eliberto Ivory, Mom Varicella Reactive (01/28 1523)  Prenatal Classes possibly GBS  (For PCN allergy, check sensitivities)     Hep C Non Reactive (01/28 1523)   BTL Consent  Pap Diagnosis  Date Value Ref Range Status  11/15/2022   Final   - Negative for intraepithelial lesion or malignancy (NILM)    VBAC Consent NA Hgb Electro      CF   GC/CHLM NEG/NEG 07/06/23 SMA          Preterm labor symptoms and general obstetric precautions including but not limited to vaginal bleeding, contractions, leaking of fluid and fetal movement were reviewed in detail with the patient. Please refer to After Visit Summary for other counseling recommendations.   Return in about 4 weeks (around 02/16/2024) for rob.  Tresea Mall, CNM 01/19/2024  9:52 AM

## 2024-01-19 NOTE — Patient Instructions (Signed)
 Second Trimester of Pregnancy  The second trimester of pregnancy is from week 14 through week 27. This is months 4 through 6 of pregnancy. During the second trimester: Morning sickness is less or has stopped. You may have more energy. You may feel hungry more often. At this time, your unborn baby is growing very fast. At the end of the sixth month, the unborn baby may be up to 12 inches long and weigh about 1 pounds. You will likely start to feel the baby move between 16 and 20 weeks of pregnancy. Body changes during your second trimester Your body continues to change during this time. The changes usually go away after your baby is born. Physical changes You will gain more weight. Your belly will get bigger. You may begin to get stretch marks on your hips, belly, and breasts. Your breasts will keep growing and may hurt. You may get dark spots or blotches on your face. A dark line from your belly button to the pubic area may appear. This line is called linea nigra. Your hair may grow faster and get thicker. Health changes You may have headaches. You may have heartburn. You may pee more often. You may have swollen, bulging veins (varicose veins). You may have trouble pooping (constipation), or swollen veins in the butt that can itch or get painful (hemorrhoids). You may have back pain. This is caused by: Weight gain. Pregnancy hormones that are relaxing the joints in your pelvis. Follow these instructions at home: Medicines Talk to your health care provider if you're taking medicines. Ask if the medicines are safe to take during pregnancy. Your provider may change the medicines that you take. Do not take any medicines unless told to by your provider. Take a prenatal vitamin that has at least 600 micrograms (mcg) of folic acid. Do not use herbal medicines, illegal drugs, or medicines that are not approved by your provider. Eating and drinking While you're pregnant your body needs  extra food for your growing baby. Talk with your provider about what to eat while pregnant. Activity Most women are able to exercise during pregnancy. Exercises may need to change as your pregnancy goes on. Talk to your provider about your activities and exercise routines. Relieving pain and discomfort Wear a good, supportive bra if your breasts hurt. Rest with your legs raised if you have leg cramps or low back pain. Take warm sitz baths to soothe pain from hemorrhoids. Use hemorrhoid cream if your provider says it's okay. Do not douche. Do not use tampons or scented pads. Do not use hot tubs, steam rooms, or saunas. Safety Wear your seatbelt at all times when you're in a car. Talk to your provider if someone hits you, hurts you, or yells at you. Talk with your provider if you're feeling sad or have thoughts of hurting yourself. Lifestyle Certain things can be harmful while you're pregnant. It's best to avoid the following: Do not drink alcohol,smoke, vape, or use products with nicotine or tobacco in them. If you need help quitting, talk with your provider. Avoid cat litter boxes and soil used by cats. These things carry germs that can cause harm to your pregnancy and your baby. General instructions Keep all follow-up visits. It helps you and your unborn baby stay as healthy as possible. Write down your questions. Take them to your prenatal visits. Your provider will: Talk with you about your overall health. Give you advice or refer you to specialists who can help with different needs,  including: Prenatal education classes. Mental health and counseling. Foods and healthy eating. Ask for help if you need help with food. Where to find more information American Pregnancy Association: americanpregnancy.org [employer] of Obstetricians and Gynecologists: acog.org Office on [employer] Health: [employer] Contact a health care provider if: You have a headache that does not go away  when you take medicine. You have any of these problems: You can't eat or drink. You throw up or feel like you may throw up. You have watery poop (diarrhea) for 2 days or more. You have pain when you pee or your pee smells bad. You have been sick for 2 days or more and are not getting better. Contact your provider right away if: You have any of these coming from your vagina: Abnormal discharge. Bad-smelling fluid. Bleeding. Your baby is moving less than usual. You have contractions, belly cramping, or have pain in your pelvis or lower back. You have symptoms of high blood pressure or preeclampsia. These include: A severe, throbbing headache that does not go away. Sudden or extreme swelling of your face, hands, legs, or feet. Vision problems: You see spots. You have blurry vision. Your eyes are sensitive to light. If you can't reach the provider, go to an urgent care or emergency room. Get help right away if: You faint, become confused, or can't think clearly. You have chest pain or trouble breathing. You have any kind of injury, such as from a fall or a car crash. These symptoms may be an emergency. Call 911 right away. Do not wait to see if the symptoms will go away. Do not drive yourself to the hospital. This information is not intended to replace advice given to you by your health care provider. Make sure you discuss any questions you have with your health care provider. Document Revised: 07/20/2023 Document Reviewed: 02/17/2023 Elsevier Patient Education  2024 [employer].

## 2024-02-15 ENCOUNTER — Ambulatory Visit (INDEPENDENT_AMBULATORY_CARE_PROVIDER_SITE_OTHER)

## 2024-02-15 VITALS — BP 121/72 | HR 87 | Wt 156.0 lb

## 2024-02-15 DIAGNOSIS — Z131 Encounter for screening for diabetes mellitus: Secondary | ICD-10-CM | POA: Diagnosis not present

## 2024-02-15 DIAGNOSIS — D649 Anemia, unspecified: Secondary | ICD-10-CM

## 2024-02-15 DIAGNOSIS — Z113 Encounter for screening for infections with a predominantly sexual mode of transmission: Secondary | ICD-10-CM

## 2024-02-15 DIAGNOSIS — Z3400 Encounter for supervision of normal first pregnancy, unspecified trimester: Secondary | ICD-10-CM | POA: Diagnosis not present

## 2024-02-15 NOTE — Progress Notes (Addendum)
    Return Prenatal Note   Assessment/Plan   Plan  23 y.o. G1P0 at [redacted]w[redacted]d presents for follow-up OB visit. Reviewed prenatal record including previous visit note.  Supervision of normal pregnancy Doing well with no concerns today. Reports having some pain during sex that has been happening since the beginning of pregnancy, that she describes as a 'ripping' sensation. No VB, or pain at other times.  Discussed the possible need for additional lubrication during sex d/t normal hormonal changes.  Pt declines vaginal exam at this time.  Anticipatory guidance given for 28 week labs and GTT screening at next visit.  Encouraged childbirth education classes or tours.     Orders Placed This Encounter  Procedures   28 Week RH+Panel    Standing Status:   Future    Expected Date:   02/29/2024    Expiration Date:   02/14/2025   Return in about 4 weeks (around 03/14/2024) for ROB with 1 hour glucola.   Future Appointments  Date Time Provider Department Center  03/14/2024  8:20 AM AOB-OBGYN LAB [facility]OB-A[facility] None  03/14/2024  8:55 AM Forestine Igo, CNM AOB-A[facility] None    For next visit:  ROB with 1 hour gluocla, third trimester labs, and Tdap     Subjective   23 y.o. G1P0 at [redacted]w[redacted]d presents for this follow-up prenatal visit.  Patient Doing well, some c/o pain during sex. No OB concerns, good FM. Patient reports: Movement: Present Contractions: Not present  Objective   Flow sheet Vitals: Pulse Rate: 87 BP: 121/72 Fundal Height: 24 cm Fetal Heart Rate (bpm): 145 Total weight gain: 14.5 kg  General Appearance  No acute distress, well appearing, and well nourished Pulmonary   Normal work of breathing Neurologic   Alert and oriented to person, place, and time Psychiatric   Mood and affect within normal limits  Misty Garza, Student-MidWife  04/17/253:54 PM

## 2024-02-15 NOTE — Assessment & Plan Note (Signed)
 Doing well with no concerns today. Reports having some pain during sex that has been happening since the beginning of pregnancy, that she describes as a 'ripping' sensation. No VB, or pain at other times.  Discussed the possible need for additional lubrication during sex d/t normal hormonal changes.  Pt declines vaginal exam at this time.  Anticipatory guidance given for 28 week labs and GTT screening at next visit.  Encouraged childbirth education classes or tours.

## 2024-03-14 ENCOUNTER — Ambulatory Visit (INDEPENDENT_AMBULATORY_CARE_PROVIDER_SITE_OTHER): Admitting: Certified Nurse Midwife

## 2024-03-14 ENCOUNTER — Encounter: Payer: Self-pay | Admitting: Certified Nurse Midwife

## 2024-03-14 ENCOUNTER — Other Ambulatory Visit

## 2024-03-14 VITALS — BP 105/72 | HR 96 | Wt 163.0 lb

## 2024-03-14 DIAGNOSIS — Z131 Encounter for screening for diabetes mellitus: Secondary | ICD-10-CM | POA: Diagnosis not present

## 2024-03-14 DIAGNOSIS — Z3400 Encounter for supervision of normal first pregnancy, unspecified trimester: Secondary | ICD-10-CM

## 2024-03-14 DIAGNOSIS — Z113 Encounter for screening for infections with a predominantly sexual mode of transmission: Secondary | ICD-10-CM | POA: Diagnosis not present

## 2024-03-14 DIAGNOSIS — Z3A28 28 weeks gestation of pregnancy: Secondary | ICD-10-CM

## 2024-03-14 DIAGNOSIS — D649 Anemia, unspecified: Secondary | ICD-10-CM

## 2024-03-14 NOTE — Progress Notes (Signed)
 1   Return Prenatal Note   Subjective   23 y.o. G1P0 at [redacted]w[redacted]d presents for this follow-up prenatal visit.  Patient feeling well, unsure about receiving TDAP, indications reviewed. Patient reports: Movement: Present Contractions: Not present  Objective   Flow sheet Vitals: Pulse Rate: 96 BP: 105/72 Fundal Height: 28 cm Fetal Heart Rate (bpm): 140 Total weight gain: 39 lb (17.7 kg)  General Appearance  No acute distress, well appearing, and well nourished Pulmonary   Normal work of breathing Neurologic   Alert and oriented to person, place, and time Psychiatric   Mood and affect within normal limits  Assessment/Plan   Plan  23 y.o. G1P0 at [redacted]w[redacted]d presents for follow-up OB visit. Reviewed prenatal record including previous visit note.  Supervision of normal pregnancy Reviewed kick counts and preterm labor warning signs. Instructed to call office or come to hospital with persistent headache, vision changes, regular contractions, leaking of fluid, decreased fetal movement or vaginal bleeding. Encoruaged CbEd, list sent via MyChart.      No orders of the defined types were placed in this encounter.  Return in 2 weeks (on 03/28/2024) for ROB.   Future Appointments  Date Time Provider Department Center  03/28/2024  9:55 AM [place], Sherline Distel, CNM AOB-AOB None    For next visit:  continue with routine prenatal care     Forestine Igo, CNM  03/15/2511:16 PM

## 2024-03-14 NOTE — Patient Instructions (Signed)
 Third Trimester of Pregnancy  The third trimester of pregnancy is from week 28 through week 40. This is months 7 through 9. The third trimester is a time when your baby is growing fast. Body changes during your third trimester Your body continues to change during this time. The changes usually go away after your baby is born. Physical changes You will continue to gain weight. You may get stretch marks on your hips, belly, and breasts. Your breasts will keep growing and may hurt. A yellow fluid (colostrum) may leak from your breasts. This is the first milk you're making for your baby. Your hair may grow faster and get thicker. In some cases, you may get hair loss. Your belly button may stick out. You may have more swelling in your hands, face, or ankles. Health changes You may have heartburn. You may feel short of breath. This is caused by the uterus that is now bigger. You may have more aches in the pelvis, back, or thighs. You may have more tingling or numbness in your hands, arms, and legs. You may pee more often. You may have trouble pooping (constipation) or swollen veins in the butt that can itch or get painful (hemorrhoids). Other changes You may have more problems sleeping. You may notice the baby moving lower in your belly (dropping). You may have more fluid coming from your vagina. Your joints may feel loose, and you may have pain around your pelvic bone. Follow these instructions at home: Medicines Take medicines only as told by your health care provider. Some medicines are not safe during pregnancy. Your provider may change the medicines that you take. Do not take any medicines unless told to by your provider. Take a prenatal vitamin that has at least 600 micrograms (mcg) of folic acid. Do not use herbal medicines, illegal drugs, or medicines that are not approved by your provider. Eating and drinking While you're pregnant your body needs additional nutrition to help  support your growing baby. Talk with your provider about your nutritional needs. Activity Most women are able to exercise regularly during pregnancy. Exercise routines may need to change at the end of your pregnancy. Talk to your provider about your activities and exercise routine. Relieving pain and discomfort Rest often with your legs raised if you have leg cramps or low back pain. Take warm sitz baths to soothe pain from hemorrhoids. Use hemorrhoid cream if your provider says it's okay. Wear a good, supportive bra if your breasts hurt. Do not use hot tubs, steam rooms, or saunas. Do not douche. Do not use tampons or scented pads. Safety Talk to your provider before traveling far distances. Wear your seatbelt at all times when you're in a car. Talk to your provider if someone hits you, hurts you, or yells at you. Preparing for birth To prepare for your baby: Take childbirth and breastfeeding classes. Visit the hospital and tour the maternity area. Buy a rear-facing car seat. Learn how to install it in your car. General instructions Avoid cat litter boxes and soil used by cats. These things carry germs that can cause harm to your pregnancy and your baby. Do not drink alcohol, smoke, vape, or use products with nicotine or tobacco in them. If you need help quitting, talk with your provider. Keep all follow-up visits for your third trimester. Your provider will do more exams and tests during this trimester. Write down your questions. Take them to your prenatal visits. Your provider also will: Talk with you about  your overall health. Give you advice or refer you to specialists who can help with different needs, including: Mental health and counseling. Foods and healthy eating. Ask for help if you need help with food. Where to find more information American Pregnancy Association: americanpregnancy.org [employer]necologists: acog.org Office on [employer] Health:  [employer] Contact a health care provider if: You have a headache that does not go away when you take medicine. You have any of these problems: You can't eat or drink. You have nausea and vomiting. You have watery poop (diarrhea) for 2 days or more. You have pain when you pee, or your pee smells bad. You have been sick for 2 days or more and aren't getting better. Contact your provider right away if: You have any of these coming from your vagina: Abnormal discharge. Bad-smelling fluid. Bleeding. Your baby is moving less than usual. You have signs of labor: You have any contractions, belly cramping, or have pain in your pelvis or lower back before 37 weeks of pregnancy (preterm labor). You have regular contractions that are less than 5 minutes apart. Your water breaks. You have symptoms of high blood pressure or preeclampsia. These include: A severe, throbbing headache that does not go away. Sudden or extreme swelling of your face, hands, legs, or feet. Vision problems: You see spots. You have blurry vision. Your eyes are sensitive to light. If you can't reach your provider, go to an urgent care or emergency room. Get help right away if: You faint, become confused, or can't think clearly. You have chest pain or trouble breathing. You have any kind of injury, such as from a fall or a car crash. These symptoms may be an emergency. Call 911 right away. Do not wait to see if the symptoms will go away. Do not drive yourself to the hospital. This information is not intended to replace advice given to you by your health care provider. Make sure you discuss any questions you have with your health care provider. Document Revised: 07/20/2023 Document Reviewed: 02/17/2023 Elsevier Patient Education  2024 [employer].

## 2024-03-14 NOTE — Assessment & Plan Note (Signed)
 Reviewed kick counts and preterm labor warning signs. Instructed to call office or come to hospital with persistent headache, vision changes, regular contractions, leaking of fluid, decreased fetal movement or vaginal bleeding. Encoruaged CbEd, list sent via MyChart.

## 2024-03-15 LAB — 28 WEEK RH+PANEL
Basophils Absolute: 0 10*3/uL (ref 0.0–0.2)
Basos: 0 %
EOS (ABSOLUTE): 0 10*3/uL (ref 0.0–0.4)
Eos: 0 %
Gestational Diabetes Screen: 91 mg/dL (ref 70–139)
HIV Screen 4th Generation wRfx: NONREACTIVE
Hematocrit: 36.6 % (ref 34.0–46.6)
Hemoglobin: 12.1 g/dL (ref 11.1–15.9)
Immature Grans (Abs): 0.3 10*3/uL — ABNORMAL HIGH (ref 0.0–0.1)
Immature Granulocytes: 2 %
Lymphocytes Absolute: 1.5 10*3/uL (ref 0.7–3.1)
Lymphs: 10 %
MCH: 31.5 pg (ref 26.6–33.0)
MCHC: 33.1 g/dL (ref 31.5–35.7)
MCV: 95 fL (ref 79–97)
Monocytes Absolute: 1 10*3/uL — ABNORMAL HIGH (ref 0.1–0.9)
Monocytes: 6 %
Neutrophils Absolute: 13 10*3/uL — ABNORMAL HIGH (ref 1.4–7.0)
Neutrophils: 82 %
Platelets: 243 10*3/uL (ref 150–450)
RBC: 3.84 x10E6/uL (ref 3.77–5.28)
RDW: 12.4 % (ref 11.7–15.4)
RPR Ser Ql: NONREACTIVE
WBC: 15.9 10*3/uL — ABNORMAL HIGH (ref 3.4–10.8)

## 2024-03-18 ENCOUNTER — Ambulatory Visit: Payer: Self-pay

## 2024-03-28 ENCOUNTER — Ambulatory Visit (INDEPENDENT_AMBULATORY_CARE_PROVIDER_SITE_OTHER): Admitting: Certified Nurse Midwife

## 2024-03-28 ENCOUNTER — Encounter: Payer: Self-pay | Admitting: Certified Nurse Midwife

## 2024-03-28 VITALS — BP 111/73 | HR 97 | Wt 167.9 lb

## 2024-03-28 DIAGNOSIS — Z3400 Encounter for supervision of normal first pregnancy, unspecified trimester: Secondary | ICD-10-CM

## 2024-03-28 NOTE — Assessment & Plan Note (Signed)
 Feeling well overall. Reviewed some birth questions.  Reviewed kick counts and preterm labor warning signs. Instructed to call office or come to hospital with persistent headache, vision changes, regular contractions, leaking of fluid, decreased fetal movement or vaginal bleeding.

## 2024-03-28 NOTE — Progress Notes (Signed)
    Return Prenatal Note   Assessment/Plan   Plan  23 y.o. G1P0 at [redacted]w[redacted]d presents for follow-up OB visit. Reviewed prenatal record including previous visit note.  Supervision of normal pregnancy Feeling well overall. Reviewed some birth questions.  Reviewed kick counts and preterm labor warning signs. Instructed to call office or come to hospital with persistent headache, vision changes, regular contractions, leaking of fluid, decreased fetal movement or vaginal bleeding.    No orders of the defined types were placed in this encounter.  No follow-ups on file.   Future Appointments  Date Time Provider Department Center  04/11/2024  2:35 PM Phylliss Brenner, CNM [facility] None    For next visit:  continue with routine prenatal care     Subjective   23 y.o. G1P0 at [redacted]w[redacted]d presents for this follow-up prenatal visit.  Patient has no concerns today.  Patient reports: Movement: Present Contractions: Not present  Objective   Flow sheet Vitals: Pulse Rate: 97 BP: 111/73 Fundal Height: 30 cm Fetal Heart Rate (bpm): 150 Total weight gain: 43 lb 14.4 oz (19.9 kg)  General Appearance  No acute distress, well appearing, and well nourished Pulmonary   Normal work of breathing Neurologic   Alert and oriented to person, place, and time Psychiatric   Mood and affect within normal limits  Donato Fu, CNM  05/29/251:45 PM

## 2024-04-11 ENCOUNTER — Encounter: Admitting: Obstetrics

## 2024-04-11 ENCOUNTER — Telehealth: Payer: Self-pay | Admitting: Obstetrics

## 2024-04-11 NOTE — Telephone Encounter (Signed)
 Pt was scheduled for a ROB appt on 04/11/2024 at 2:35 with Cinda Craze.  Pt forgot the appt and called in to reschedule.  She was rescheduled to 04/15/2024 at 3:35 with E. Slaughterbeck.

## 2024-04-15 ENCOUNTER — Other Ambulatory Visit (HOSPITAL_COMMUNITY)
Admission: RE | Admit: 2024-04-15 | Discharge: 2024-04-15 | Disposition: A | Discharge status: Home / Self Care | Source: Ambulatory Visit | Attending: Certified Nurse Midwife | Admitting: Certified Nurse Midwife

## 2024-04-15 ENCOUNTER — Ambulatory Visit (INDEPENDENT_AMBULATORY_CARE_PROVIDER_SITE_OTHER): Admitting: Certified Nurse Midwife

## 2024-04-15 VITALS — BP 116/70 | HR 93 | Wt 172.3 lb

## 2024-04-15 DIAGNOSIS — N898 Other specified noninflammatory disorders of vagina: Secondary | ICD-10-CM

## 2024-04-15 DIAGNOSIS — Z3400 Encounter for supervision of normal first pregnancy, unspecified trimester: Secondary | ICD-10-CM | POA: Diagnosis not present

## 2024-04-15 DIAGNOSIS — Z3A33 33 weeks gestation of pregnancy: Secondary | ICD-10-CM

## 2024-04-15 NOTE — Assessment & Plan Note (Signed)
 Reviewed normal skin changes in pregnancy due to hormones. Advised clear and free lotions, soaps and detergents.  Reviewed normal vaginal discharge and pH changes during pregnancy. Swabs collected for yeast or BV. Reviewed kick counts and preterm labor warning signs. Instructed to call office or come to hospital with persistent headache, vision changes, regular contractions, leaking of fluid, decreased fetal movement or vaginal bleeding.

## 2024-04-15 NOTE — Progress Notes (Signed)
    Return Prenatal Note   Subjective   23 y.o. G1P0 at [redacted]w[redacted]d presents for this follow-up prenatal visit.  Patient: She has a rash on her stomach, back, arms and shoulders x 2 weeks. She denies itching or discomfort.  She has vaginal odor and wants to be educated about PH-balance and prevention measures.  Patient reports: Red bumps on her body mainly in upper arms, back and belly and increased vaginal discharge with no itching or discomfort.  Movement: Present Contractions: Irritability  Objective   Flow sheet Vitals: Pulse Rate: 93 BP: 116/70 Fundal Height: 33 cm Fetal Heart Rate (bpm): 136 Total weight gain: 48 lb 4.8 oz (21.9 kg)  General Appearance  No acute distress, well appearing, and well nourished Pulmonary   Normal work of breathing Neurologic   Alert and oriented to person, place, and time Psychiatric   Mood and affect within normal limits Skin - small red bumps on upper arms, back and belly. No drainage  Assessment/Plan   Plan  23 y.o. G1P0 at [redacted]w[redacted]d presents for follow-up OB visit. Reviewed prenatal record including previous visit note.  Supervision of normal pregnancy Reviewed normal skin changes in pregnancy due to hormones. Advised clear and free lotions, soaps and detergents.  Reviewed normal vaginal discharge and pH changes during pregnancy. Swabs collected for yeast or BV. Reviewed kick counts and preterm labor warning signs. Instructed to call office or come to hospital with persistent headache, vision changes, regular contractions, leaking of fluid, decreased fetal movement or vaginal bleeding.       No orders of the defined types were placed in this encounter.  Return in about 2 weeks (around 04/29/2024) for ROB.   Future Appointments  Date Time Provider Department Center  04/29/2024  3:35 PM Domonic Hiscox, Sherline Distel, CNM [facility] None    For next visit:  ROB with GBS screening      Donato Fu, CNM [facility]  [place] 06/16/254:23 PM

## 2024-04-15 NOTE — Addendum Note (Signed)
 Addended by: Aldona Amel on: 04/15/2024 04:39 PM   Modules accepted: Orders

## 2024-04-17 LAB — CERVICOVAGINAL ANCILLARY ONLY
Bacterial Vaginitis (gardnerella): NEGATIVE
Candida Glabrata: NEGATIVE
Candida Vaginitis: POSITIVE — AB
Comment: NEGATIVE
Comment: NEGATIVE
Comment: NEGATIVE

## 2024-04-19 ENCOUNTER — Ambulatory Visit: Payer: Self-pay | Admitting: Certified Nurse Midwife

## 2024-04-29 ENCOUNTER — Ambulatory Visit (INDEPENDENT_AMBULATORY_CARE_PROVIDER_SITE_OTHER): Admitting: Certified Nurse Midwife

## 2024-04-29 ENCOUNTER — Encounter: Payer: Self-pay | Admitting: Certified Nurse Midwife

## 2024-04-29 VITALS — BP 112/72 | HR 98 | Wt 176.1 lb

## 2024-04-29 DIAGNOSIS — Z3400 Encounter for supervision of normal first pregnancy, unspecified trimester: Secondary | ICD-10-CM | POA: Diagnosis not present

## 2024-04-29 NOTE — Assessment & Plan Note (Signed)
 Continues with rash with no complaint of itching.  Reviewed labor warning signs and expectations for birth. Instructed to call office or come to hospital with persistent headache, vision changes, regular contractions, leaking of fluid, decreased fetal movement or vaginal bleeding.

## 2024-04-29 NOTE — Progress Notes (Signed)
    Return Prenatal Note   Assessment/Plan   Plan  23 y.o. G1P0 at [redacted]w[redacted]d presents for follow-up OB visit. Reviewed prenatal record including previous visit note.  Supervision of normal pregnancy Continues with rash with no complaint of itching.  Reviewed labor warning signs and expectations for birth. Instructed to call office or come to hospital with persistent headache, vision changes, regular contractions, leaking of fluid, decreased fetal movement or vaginal bleeding.    No orders of the defined types were placed in this encounter.  No follow-ups on file.   Future Appointments  Date Time Provider Department Center  05/07/2024 10:35 AM Justino Eleanor HERO, CNM [facility] None    For next visit:  continue with routine prenatal care     Subjective   23 y.o. G1P0 at [redacted]w[redacted]d presents for this follow-up prenatal visit.  Patient reports rash is still present but she does not have discomfort from it.  Patient reports: Movement: Present Contractions: Not present  Objective   Flow sheet Vitals: Pulse Rate: 98 BP: 112/72 Fundal Height: 35 cm Fetal Heart Rate (bpm): 125 Presentation: Vertex (Leopolds) Total weight gain: 52 lb 1.6 oz (23.6 kg)  General Appearance  No acute distress, well appearing, and well nourished Pulmonary   Normal work of breathing Neurologic   Alert and oriented to person, place, and time Psychiatric   Mood and affect within normal limits  Damien PARSLEY, CNM  06/30/255:18 PM

## 2024-05-07 ENCOUNTER — Encounter: Payer: Self-pay | Admitting: Obstetrics

## 2024-05-07 ENCOUNTER — Ambulatory Visit (INDEPENDENT_AMBULATORY_CARE_PROVIDER_SITE_OTHER): Admitting: Obstetrics

## 2024-05-07 ENCOUNTER — Other Ambulatory Visit (HOSPITAL_COMMUNITY)
Admission: RE | Admit: 2024-05-07 | Discharge: 2024-05-07 | Disposition: A | Discharge status: Home / Self Care | Source: Ambulatory Visit | Attending: Obstetrics | Admitting: Obstetrics

## 2024-05-07 VITALS — BP 121/87 | HR 102 | Wt 177.0 lb

## 2024-05-07 DIAGNOSIS — Z3A36 36 weeks gestation of pregnancy: Secondary | ICD-10-CM | POA: Insufficient documentation

## 2024-05-07 DIAGNOSIS — Z3400 Encounter for supervision of normal first pregnancy, unspecified trimester: Secondary | ICD-10-CM | POA: Diagnosis not present

## 2024-05-07 DIAGNOSIS — Z3403 Encounter for supervision of normal first pregnancy, third trimester: Secondary | ICD-10-CM | POA: Insufficient documentation

## 2024-05-07 DIAGNOSIS — Z3685 Encounter for antenatal screening for Streptococcus B: Secondary | ICD-10-CM | POA: Insufficient documentation

## 2024-05-07 DIAGNOSIS — Z113 Encounter for screening for infections with a predominantly sexual mode of transmission: Secondary | ICD-10-CM | POA: Diagnosis not present

## 2024-05-07 NOTE — Assessment & Plan Note (Signed)
-  36-week swabs collected -Discussed yeast prevention. Will f/u based on swab results -Herbal labor prep handout given -Reviewed labor warning signs. Instructed to call office or come to hospital with persistent headache, vision changes, regular contractions, leaking of fluid, decreased fetal movement or vaginal bleeding.

## 2024-05-07 NOTE — Progress Notes (Signed)
    Return Prenatal Note   Assessment/Plan   Plan  23 y.o. G1P0 at [redacted]w[redacted]d presents for follow-up OB visit. Reviewed prenatal record including previous visit note.   Supervision of normal pregnancy -36-week swabs collected -Discussed yeast prevention. Will f/u based on swab results -Herbal labor prep handout given -Reviewed labor warning signs. Instructed to call office or come to hospital with persistent headache, vision changes, regular contractions, leaking of fluid, decreased fetal movement or vaginal bleeding.     Orders Placed This Encounter  Procedures   Strep Gp B Culture+Rflx   Return in about 1 week (around 05/14/2024).   Future Appointments  Date Time Provider Department Center  05/14/2024 10:35 AM Janit Alm Agent, MD [facility] None    For next visit:  Routine prenatal care    Subjective   Trezure completed a course of Monistat 7 and is still having increased discharge. She is planning an epidural for labor.   Movement: Present Contractions: Not present  Objective   Flow sheet Vitals: Pulse Rate: (!) 102 BP: 121/87 Fundal Height: 35 cm Fetal Heart Rate (bpm): 147 Total weight gain: 53 lb (24 kg)  General Appearance  No acute distress, well appearing, and well nourished Pulmonary   Normal work of breathing Neurologic   Alert and oriented to person, place, and time Psychiatric   Mood and affect within normal limits  Eleanor Canny, CNM 05/07/24 11:14 AM

## 2024-05-08 ENCOUNTER — Ambulatory Visit: Payer: Self-pay | Admitting: Obstetrics

## 2024-05-08 LAB — CERVICOVAGINAL ANCILLARY ONLY
Bacterial Vaginitis (gardnerella): NEGATIVE
Candida Glabrata: NEGATIVE
Candida Vaginitis: NEGATIVE
Chlamydia: NEGATIVE
Comment: NEGATIVE
Comment: NEGATIVE
Comment: NEGATIVE
Comment: NEGATIVE
Comment: NORMAL
Neisseria Gonorrhea: NEGATIVE

## 2024-05-11 LAB — STREP GP B CULTURE+RFLX: Strep Gp B Culture+Rflx: NEGATIVE

## 2024-05-14 ENCOUNTER — Encounter: Payer: Self-pay | Admitting: Obstetrics and Gynecology

## 2024-05-14 ENCOUNTER — Ambulatory Visit (INDEPENDENT_AMBULATORY_CARE_PROVIDER_SITE_OTHER): Admitting: Obstetrics and Gynecology

## 2024-05-14 VITALS — BP 112/75 | HR 96 | Wt 182.9 lb

## 2024-05-14 DIAGNOSIS — Z3400 Encounter for supervision of normal first pregnancy, unspecified trimester: Secondary | ICD-10-CM | POA: Diagnosis not present

## 2024-05-14 DIAGNOSIS — Z3A37 37 weeks gestation of pregnancy: Secondary | ICD-10-CM | POA: Diagnosis not present

## 2024-05-14 NOTE — Progress Notes (Signed)
 ROB. Patient states daily fetal movement with occasional braxton hicks. GBS neg. She states no questions or concerns at this time.

## 2024-05-14 NOTE — Progress Notes (Signed)
 ROB:  EGA = 37.3.  She says she is doing well.  Not having contractions.  Signs and symptoms of labor reviewed including timing of contractions SROM.  Induction near 41 weeks discussed if not spontaneous labor.  All questions answered.  GBS, GC/CT negative.

## 2024-05-21 ENCOUNTER — Ambulatory Visit (INDEPENDENT_AMBULATORY_CARE_PROVIDER_SITE_OTHER): Admitting: Certified Nurse Midwife

## 2024-05-21 VITALS — BP 128/80 | HR 110 | Wt 184.9 lb

## 2024-05-21 DIAGNOSIS — Z3A38 38 weeks gestation of pregnancy: Secondary | ICD-10-CM | POA: Diagnosis not present

## 2024-05-21 DIAGNOSIS — Z3400 Encounter for supervision of normal first pregnancy, unspecified trimester: Secondary | ICD-10-CM | POA: Diagnosis not present

## 2024-05-21 NOTE — Assessment & Plan Note (Signed)
 Reviewed labor warning signs and expectations for birth. Instructed to call office or come to hospital with persistent headache, vision changes, regular contractions, leaking of fluid, decreased fetal movement or vaginal bleeding.

## 2024-05-21 NOTE — Patient Instructions (Signed)
 Third Trimester of Pregnancy  The third trimester of pregnancy is from week 28 through week 40. This is months 7 through 9. The third trimester is a time when your baby is growing fast. Body changes during your third trimester Your body continues to change during this time. The changes usually go away after your baby is born. Physical changes You will continue to gain weight. You may get stretch marks on your hips, belly, and breasts. Your breasts will keep growing and may hurt. A yellow fluid (colostrum) may leak from your breasts. This is the first milk you're making for your baby. Your hair may grow faster and get thicker. In some cases, you may get hair loss. Your belly button may stick out. You may have more swelling in your hands, face, or ankles. Health changes You may have heartburn. You may feel short of breath. This is caused by the uterus that is now bigger. You may have more aches in the pelvis, back, or thighs. You may have more tingling or numbness in your hands, arms, and legs. You may pee more often. You may have trouble pooping (constipation) or swollen veins in the butt that can itch or get painful (hemorrhoids). Other changes You may have more problems sleeping. You may notice the baby moving lower in your belly (dropping). You may have more fluid coming from your vagina. Your joints may feel loose, and you may have pain around your pelvic bone. Follow these instructions at home: Medicines Take medicines only as told by your health care provider. Some medicines are not safe during pregnancy. Your provider may change the medicines that you take. Do not take any medicines unless told to by your provider. Take a prenatal vitamin that has at least 600 micrograms (mcg) of folic acid. Do not use herbal medicines, illegal drugs, or medicines that are not approved by your provider. Eating and drinking While you're pregnant your body needs additional nutrition to help  support your growing baby. Talk with your provider about your nutritional needs. Activity Most women are able to exercise regularly during pregnancy. Exercise routines may need to change at the end of your pregnancy. Talk to your provider about your activities and exercise routine. Relieving pain and discomfort Rest often with your legs raised if you have leg cramps or low back pain. Take warm sitz baths to soothe pain from hemorrhoids. Use hemorrhoid cream if your provider says it's okay. Wear a good, supportive bra if your breasts hurt. Do not use hot tubs, steam rooms, or saunas. Do not douche. Do not use tampons or scented pads. Safety Talk to your provider before traveling far distances. Wear your seatbelt at all times when you're in a car. Talk to your provider if someone hits you, hurts you, or yells at you. Preparing for birth To prepare for your baby: Take childbirth and breastfeeding classes. Visit the hospital and tour the maternity area. Buy a rear-facing car seat. Learn how to install it in your car. General instructions Avoid cat litter boxes and soil used by cats. These things carry germs that can cause harm to your pregnancy and your baby. Do not drink alcohol, smoke, vape, or use products with nicotine or tobacco in them. If you need help quitting, talk with your provider. Keep all follow-up visits for your third trimester. Your provider will do more exams and tests during this trimester. Write down your questions. Take them to your prenatal visits. Your provider also will: Talk with you about  your overall health. Give you advice or refer you to specialists who can help with different needs, including: Mental health and counseling. Foods and healthy eating. Ask for help if you need help with food. Where to find more information American Pregnancy Association: americanpregnancy.org [employer]necologists: acog.org Office on [employer] Health:  [employer] Contact a health care provider if: You have a headache that does not go away when you take medicine. You have any of these problems: You can't eat or drink. You have nausea and vomiting. You have watery poop (diarrhea) for 2 days or more. You have pain when you pee, or your pee smells bad. You have been sick for 2 days or more and aren't getting better. Contact your provider right away if: You have any of these coming from your vagina: Abnormal discharge. Bad-smelling fluid. Bleeding. Your baby is moving less than usual. You have signs of labor: You have any contractions, belly cramping, or have pain in your pelvis or lower back before 37 weeks of pregnancy (preterm labor). You have regular contractions that are less than 5 minutes apart. Your water breaks. You have symptoms of high blood pressure or preeclampsia. These include: A severe, throbbing headache that does not go away. Sudden or extreme swelling of your face, hands, legs, or feet. Vision problems: You see spots. You have blurry vision. Your eyes are sensitive to light. If you can't reach your provider, go to an urgent care or emergency room. Get help right away if: You faint, become confused, or can't think clearly. You have chest pain or trouble breathing. You have any kind of injury, such as from a fall or a car crash. These symptoms may be an emergency. Call 911 right away. Do not wait to see if the symptoms will go away. Do not drive yourself to the hospital. This information is not intended to replace advice given to you by your health care provider. Make sure you discuss any questions you have with your health care provider. Document Revised: 07/20/2023 Document Reviewed: 02/17/2023 Elsevier Patient Education  2024 [employer].

## 2024-05-21 NOTE — Progress Notes (Signed)
    Return Prenatal Note   Subjective   23 y.o. G1P0 at [redacted]w[redacted]d presents for this follow-up prenatal visit.  Patient feeling well, thinks baby may have dropped. Questions answered about signs of labor & coping techniques. Patient reports: Movement: Present Contractions: Not present  Objective   Flow sheet Vitals: Pulse Rate: (!) 110 BP: 128/80 Fundal Height: 38 cm Fetal Heart Rate (bpm): 150 Presentation: Vertex Total weight gain: 60 lb 14.4 oz (27.6 kg)  General Appearance  No acute distress, well appearing, and well nourished Pulmonary   Normal work of breathing Neurologic   Alert and oriented to person, place, and time Psychiatric   Mood and affect within normal limits  Assessment/Plan   Plan  23 y.o. G1P0 at [redacted]w[redacted]d presents for follow-up OB visit. Reviewed prenatal record including previous visit note.  Supervision of normal pregnancy Reviewed labor warning signs and expectations for birth. Instructed to call office or come to hospital with persistent headache, vision changes, regular contractions, leaking of fluid, decreased fetal movement or vaginal bleeding.      No orders of the defined types were placed in this encounter.  Return in 1 week (on 05/28/2024) for ROB.   No future appointments.  For next visit:  continue with routine prenatal care     Harlene LITTIE Cisco, CNM  05/21/2510:30 AM

## 2024-05-28 ENCOUNTER — Encounter: Payer: Self-pay | Admitting: Obstetrics & Gynecology

## 2024-05-28 ENCOUNTER — Ambulatory Visit (INDEPENDENT_AMBULATORY_CARE_PROVIDER_SITE_OTHER): Admitting: Obstetrics & Gynecology

## 2024-05-28 VITALS — BP 118/77 | HR 87 | Wt 186.0 lb

## 2024-05-28 DIAGNOSIS — Z3A39 39 weeks gestation of pregnancy: Secondary | ICD-10-CM

## 2024-05-28 DIAGNOSIS — Z3400 Encounter for supervision of normal first pregnancy, unspecified trimester: Secondary | ICD-10-CM | POA: Diagnosis not present

## 2024-05-28 NOTE — Progress Notes (Signed)
   PRENATAL VISIT NOTE  Subjective:  Misty Garza is a 23 y.o. G1P0 at [redacted]w[redacted]d being seen today for ongoing prenatal care.  She is currently monitored for the following issues for this low-risk pregnancy and has Skin cyst; ADHD (attention deficit hyperactivity disorder), inattentive type; and Supervision of normal pregnancy on their problem list.  Patient reports no complaints.  Contractions: Irregular. Vag. Bleeding: None.  Movement: Present. Denies leaking of fluid.   The following portions of the patient's history were reviewed and updated as appropriate: allergies, current medications, past family history, past medical history, past social history, past surgical history and problem list.   Objective:    Vitals:   05/28/24 1115  BP: 118/77  Pulse: 87  Weight: 186 lb (84.4 kg)    Fetal Status:  Fetal Heart Rate (bpm): 135 Fundal Height: 38 cm Movement: Present Presentation: Vertex  General: Alert, oriented and cooperative. Patient is in no acute distress.  Skin: Skin is warm and dry. No rash noted.   Cardiovascular: Normal heart rate noted  Respiratory: Normal respiratory effort, no problems with respiration noted  Abdomen: Soft, gravid, appropriate for gestational age.  Pain/Pressure: Present     Pelvic: Cervical exam deferred       Patient declines this.  Extremities: Normal range of motion.  Edema: None  Mental Status: Normal mood and affect. Normal behavior. Normal judgment and thought content.   Assessment and Plan:  Pregnancy: G1P0 at [redacted]w[redacted]d 1. Supervision of normal first pregnancy, antepartum (Primary) - We reviewed s/sxs of labor, rec kick counts  2. [redacted] weeks gestation of pregnancy - will need NST at next visit  Term labor symptoms and general obstetric precautions including but not limited to vaginal bleeding, contractions, leaking of fluid and fetal movement were reviewed in detail with the patient. Please refer to After Visit Summary for other counseling  recommendations.   Return in about 1 week (around 06/04/2024) for ROB and NST.  No future appointments.  Harland JAYSON Birkenhead, MD

## 2024-06-05 ENCOUNTER — Ambulatory Visit (INDEPENDENT_AMBULATORY_CARE_PROVIDER_SITE_OTHER): Admitting: Obstetrics and Gynecology

## 2024-06-05 ENCOUNTER — Other Ambulatory Visit

## 2024-06-05 ENCOUNTER — Encounter: Payer: Self-pay | Admitting: Obstetrics and Gynecology

## 2024-06-05 VITALS — BP 120/74 | HR 78 | Wt 187.5 lb

## 2024-06-05 DIAGNOSIS — O480 Post-term pregnancy: Secondary | ICD-10-CM

## 2024-06-05 DIAGNOSIS — Z3400 Encounter for supervision of normal first pregnancy, unspecified trimester: Secondary | ICD-10-CM | POA: Diagnosis not present

## 2024-06-05 DIAGNOSIS — Z3A40 40 weeks gestation of pregnancy: Secondary | ICD-10-CM | POA: Diagnosis not present

## 2024-06-05 NOTE — Addendum Note (Signed)
 Addended by: ALAINA BIRMINGHAM R on: 06/05/2024 01:07 PM   Modules accepted: Orders

## 2024-06-05 NOTE — Progress Notes (Signed)
 ROB. Patient states daily fetal movement along with braxton hicks. NST today due to postdates. Patient states no questions or concerns at this time.

## 2024-06-05 NOTE — Addendum Note (Signed)
 Addended by: JANIT ALM PARAS on: 06/05/2024 03:14 PM   Modules accepted: Orders

## 2024-06-05 NOTE — Progress Notes (Signed)
 ROB:  EGA = 40.4.  Not feeling contractions.  Generally feels well.  Reports daily fetal movement.  NST today is reactive with negative CST.  Signs and symptoms of labor discussed.  Induction scheduled for Friday at 5 AM.  Methods of induction discussed. Cervical exam today reveals the head to be very low in the pelvis and cervix is far posterior.  Cervix feels soft.  Unable to reach cervical os because of head position and posterior cervix.

## 2024-06-05 NOTE — Addendum Note (Signed)
 Addended by: JANIT ALM PARAS on: 06/05/2024 03:35 PM   Modules accepted: Orders

## 2024-06-07 ENCOUNTER — Inpatient Hospital Stay
Admission: EM | Admit: 2024-06-07 | Discharge: 2024-06-09 | DRG: 807 | Disposition: A | Discharge status: Home / Self Care | Attending: Certified Nurse Midwife | Admitting: Certified Nurse Midwife

## 2024-06-07 ENCOUNTER — Other Ambulatory Visit: Payer: Self-pay

## 2024-06-07 ENCOUNTER — Inpatient Hospital Stay: Admitting: Anesthesiology

## 2024-06-07 ENCOUNTER — Encounter: Payer: Self-pay | Admitting: Obstetrics and Gynecology

## 2024-06-07 DIAGNOSIS — Z3A40 40 weeks gestation of pregnancy: Secondary | ICD-10-CM | POA: Diagnosis not present

## 2024-06-07 DIAGNOSIS — Z8249 Family history of ischemic heart disease and other diseases of the circulatory system: Secondary | ICD-10-CM | POA: Diagnosis not present

## 2024-06-07 DIAGNOSIS — Z880 Allergy status to penicillin: Secondary | ICD-10-CM | POA: Diagnosis not present

## 2024-06-07 DIAGNOSIS — O480 Post-term pregnancy: Secondary | ICD-10-CM | POA: Diagnosis not present

## 2024-06-07 DIAGNOSIS — Z833 Family history of diabetes mellitus: Secondary | ICD-10-CM | POA: Diagnosis not present

## 2024-06-07 DIAGNOSIS — Z3400 Encounter for supervision of normal first pregnancy, unspecified trimester: Principal | ICD-10-CM

## 2024-06-07 DIAGNOSIS — Z2882 Immunization not carried out because of caregiver refusal: Secondary | ICD-10-CM | POA: Diagnosis not present

## 2024-06-07 LAB — CBC
HCT: 39 % (ref 36.0–46.0)
Hemoglobin: 13.3 g/dL (ref 12.0–15.0)
MCH: 31.2 pg (ref 26.0–34.0)
MCHC: 34.1 g/dL (ref 30.0–36.0)
MCV: 91.5 fL (ref 80.0–100.0)
Platelets: 242 K/uL (ref 150–400)
RBC: 4.26 MIL/uL (ref 3.87–5.11)
RDW: 13.2 % (ref 11.5–15.5)
WBC: 15.8 K/uL — ABNORMAL HIGH (ref 4.0–10.5)
nRBC: 0 % (ref 0.0–0.2)

## 2024-06-07 LAB — TYPE AND SCREEN
ABO/RH(D): A POS
Antibody Screen: NEGATIVE

## 2024-06-07 LAB — ABO/RH: ABO/RH(D): A POS

## 2024-06-07 LAB — RPR: RPR Ser Ql: NONREACTIVE

## 2024-06-07 MED ORDER — COCONUT OIL OIL
1.0000 | TOPICAL_OIL | Status: DC | PRN
  Administered 2024-06-07: 1 via TOPICAL
  Filled 2024-06-07: qty 7.5

## 2024-06-07 MED ORDER — TERBUTALINE SULFATE 1 MG/ML IJ SOLN: 0.2500 mg | Freq: Once | INTRAMUSCULAR | Status: DC | PRN

## 2024-06-07 MED ORDER — PHENYLEPHRINE 80 MCG/ML (10ML) SYRINGE FOR IV PUSH (FOR BLOOD PRESSURE SUPPORT): 80.0000 ug | PREFILLED_SYRINGE | INTRAVENOUS | Status: DC | PRN

## 2024-06-07 MED ORDER — OXYTOCIN 10 UNIT/ML IJ SOLN
INTRAMUSCULAR | Status: DC
Start: 2024-06-07 — End: 2024-06-07
  Filled 2024-06-07: qty 2

## 2024-06-07 MED ORDER — PRENATAL MULTIVITAMIN CH
1.0000 | ORAL_TABLET | Freq: Every day | ORAL | Status: DC
  Filled 2024-06-07 (×2): qty 1

## 2024-06-07 MED ORDER — EPHEDRINE 5 MG/ML INJ: 10.0000 mg | INTRAVENOUS | Status: DC | PRN

## 2024-06-07 MED ORDER — MISOPROSTOL 200 MCG PO TABS
ORAL_TABLET | ORAL | Status: AC
  Filled 2024-06-07: qty 4

## 2024-06-07 MED ORDER — AMMONIA AROMATIC IN INHA
RESPIRATORY_TRACT | Status: DC
Start: 2024-06-07 — End: 2024-06-07
  Filled 2024-06-07: qty 10

## 2024-06-07 MED ORDER — OXYCODONE HCL 5 MG PO TABS
5.0000 mg | ORAL_TABLET | ORAL | Status: DC | PRN
  Filled 2024-06-07: qty 1

## 2024-06-07 MED ORDER — LIDOCAINE HCL (PF) 1 % IJ SOLN: 30.0000 mL | INTRAMUSCULAR | Status: DC | PRN

## 2024-06-07 MED ORDER — ACETAMINOPHEN 325 MG PO TABS
650.0000 mg | ORAL_TABLET | ORAL | Status: DC | PRN
  Administered 2024-06-07 – 2024-06-09 (×3): 650 mg via ORAL
  Filled 2024-06-07 (×3): qty 2

## 2024-06-07 MED ORDER — DOCUSATE SODIUM 100 MG PO CAPS
100.0000 mg | ORAL_CAPSULE | Freq: Two times a day (BID) | ORAL | Status: DC
  Administered 2024-06-08 – 2024-06-09 (×3): 100 mg via ORAL
  Filled 2024-06-07 (×3): qty 1

## 2024-06-07 MED ORDER — OXYTOCIN-SODIUM CHLORIDE 30-0.9 UT/500ML-% IV SOLN
2.5000 [IU]/h | INTRAVENOUS | Status: DC
  Filled 2024-06-07: qty 500

## 2024-06-07 MED ORDER — SIMETHICONE 80 MG PO CHEW
80.0000 mg | CHEWABLE_TABLET | ORAL | Status: DC | PRN
  Administered 2024-06-08 (×2): 80 mg via ORAL
  Filled 2024-06-07 (×2): qty 1

## 2024-06-07 MED ORDER — DIPHENHYDRAMINE HCL 50 MG/ML IJ SOLN: 12.5000 mg | INTRAMUSCULAR | Status: DC | PRN

## 2024-06-07 MED ORDER — FENTANYL CITRATE (PF) 100 MCG/2ML IJ SOLN
50.0000 ug | INTRAMUSCULAR | Status: DC | PRN
  Administered 2024-06-07: 100 ug via INTRAVENOUS
  Filled 2024-06-07: qty 2

## 2024-06-07 MED ORDER — LIDOCAINE HCL (PF) 1 % IJ SOLN
INTRAMUSCULAR | Status: AC
  Filled 2024-06-07: qty 30

## 2024-06-07 MED ORDER — ONDANSETRON HCL 4 MG/2ML IJ SOLN
4.0000 mg | Freq: Four times a day (QID) | INTRAMUSCULAR | Status: DC | PRN
  Administered 2024-06-07: 4 mg via INTRAVENOUS
  Filled 2024-06-07 (×2): qty 2

## 2024-06-07 MED ORDER — LACTATED RINGERS IV SOLN: INTRAVENOUS | Status: DC

## 2024-06-07 MED ORDER — OXYTOCIN BOLUS FROM INFUSION
333.0000 mL | Freq: Once | INTRAVENOUS | Status: AC
  Administered 2024-06-07: 333 mL via INTRAVENOUS

## 2024-06-07 MED ORDER — METHYLERGONOVINE MALEATE 0.2 MG/ML IJ SOLN: 0.2000 mg | INTRAMUSCULAR | Status: DC | PRN

## 2024-06-07 MED ORDER — FERROUS SULFATE 325 (65 FE) MG PO TABS
325.0000 mg | ORAL_TABLET | Freq: Every day | ORAL | Status: DC
  Administered 2024-06-08 – 2024-06-09 (×2): 325 mg via ORAL
  Filled 2024-06-07 (×2): qty 1

## 2024-06-07 MED ORDER — SOD CITRATE-CITRIC ACID 500-334 MG/5ML PO SOLN: 30.0000 mL | ORAL | Status: DC | PRN

## 2024-06-07 MED ORDER — BUPIVACAINE HCL (PF) 0.25 % IJ SOLN
INTRAMUSCULAR | Status: DC | PRN
  Administered 2024-06-07: 3 mL via EPIDURAL
  Administered 2024-06-07: 5 mL via EPIDURAL

## 2024-06-07 MED ORDER — PRENATAL 27-0.8 MG PO TABS: 1.0000 | ORAL_TABLET | Freq: Every day | ORAL | Status: DC

## 2024-06-07 MED ORDER — METHYLERGONOVINE MALEATE 0.2 MG PO TABS: 0.2000 mg | ORAL_TABLET | ORAL | Status: DC | PRN

## 2024-06-07 MED ORDER — ONDANSETRON HCL 4 MG/2ML IJ SOLN: 4.0000 mg | INTRAMUSCULAR | Status: DC | PRN

## 2024-06-07 MED ORDER — DIBUCAINE (PERIANAL) 1 % EX OINT
1.0000 | TOPICAL_OINTMENT | CUTANEOUS | Status: DC | PRN
  Administered 2024-06-07 – 2024-06-09 (×2): 1 via RECTAL
  Filled 2024-06-07 (×2): qty 28

## 2024-06-07 MED ORDER — MISOPROSTOL 25 MCG QUARTER TABLET
25.0000 ug | ORAL_TABLET | Freq: Once | ORAL | Status: DC
  Filled 2024-06-07: qty 1

## 2024-06-07 MED ORDER — MISOPROSTOL 50MCG HALF TABLET
50.0000 ug | ORAL_TABLET | Freq: Once | ORAL | Status: DC
  Filled 2024-06-07: qty 1

## 2024-06-07 MED ORDER — LIDOCAINE-EPINEPHRINE (PF) 1.5 %-1:200000 IJ SOLN
INTRAMUSCULAR | Status: DC | PRN
  Administered 2024-06-07: 3 mL via PERINEURAL

## 2024-06-07 MED ORDER — LACTATED RINGERS IV SOLN
500.0000 mL | Freq: Once | INTRAVENOUS | Status: AC
  Administered 2024-06-07: 500 mL via INTRAVENOUS

## 2024-06-07 MED ORDER — OXYTOCIN-SODIUM CHLORIDE 30-0.9 UT/500ML-% IV SOLN
1.0000 m[IU]/min | INTRAVENOUS | Status: DC
  Administered 2024-06-07: 2 m[IU]/min via INTRAVENOUS

## 2024-06-07 MED ORDER — BENZOCAINE-MENTHOL 20-0.5 % EX AERO
1.0000 | INHALATION_SPRAY | CUTANEOUS | Status: DC | PRN
  Administered 2024-06-07: 1 via TOPICAL
  Filled 2024-06-07 (×2): qty 56

## 2024-06-07 MED ORDER — FENTANYL-BUPIVACAINE-NACL 0.5-0.125-0.9 MG/250ML-% EP SOLN
12.0000 mL/h | EPIDURAL | Status: DC | PRN
  Administered 2024-06-07: 12 mL/h via EPIDURAL

## 2024-06-07 MED ORDER — LACTATED RINGERS IV SOLN: 500.0000 mL | INTRAVENOUS | Status: DC | PRN

## 2024-06-07 MED ORDER — LIDOCAINE HCL (PF) 1 % IJ SOLN
INTRAMUSCULAR | Status: DC | PRN
  Administered 2024-06-07: 3 mL
  Administered 2024-06-07: 1 mL

## 2024-06-07 MED ORDER — ONDANSETRON HCL 4 MG PO TABS: 4.0000 mg | ORAL_TABLET | ORAL | Status: DC | PRN

## 2024-06-07 MED ORDER — OXYCODONE HCL 5 MG PO TABS: 10.0000 mg | ORAL_TABLET | ORAL | Status: DC | PRN

## 2024-06-07 MED ORDER — IBUPROFEN 600 MG PO TABS
600.0000 mg | ORAL_TABLET | Freq: Four times a day (QID) | ORAL | Status: DC
  Administered 2024-06-07 – 2024-06-09 (×5): 600 mg via ORAL
  Filled 2024-06-07 (×5): qty 1

## 2024-06-07 MED ORDER — WITCH HAZEL-GLYCERIN EX PADS
1.0000 | MEDICATED_PAD | CUTANEOUS | Status: DC | PRN
  Administered 2024-06-07 – 2024-06-09 (×2): 1 via TOPICAL
  Filled 2024-06-07 (×2): qty 100

## 2024-06-07 MED ORDER — FENTANYL-BUPIVACAINE-NACL 0.5-0.125-0.9 MG/250ML-% EP SOLN
EPIDURAL | Status: AC
  Filled 2024-06-07: qty 250

## 2024-06-07 MED ORDER — SENNOSIDES-DOCUSATE SODIUM 8.6-50 MG PO TABS
2.0000 | ORAL_TABLET | ORAL | Status: DC
  Administered 2024-06-09: 2 via ORAL
  Filled 2024-06-07 (×2): qty 2

## 2024-06-07 MED ORDER — ACETAMINOPHEN 325 MG PO TABS: 650.0000 mg | ORAL_TABLET | ORAL | Status: DC | PRN

## 2024-06-07 NOTE — H&P (Signed)
 [facility] Labor & Delivery  History and Physical  ASSESSMENT AND PLAN   Misty Garza is a 23 y.o. G1P0 at [redacted]w[redacted]d with EDD: 06/01/2024, by Ultrasound admitted for induction of labor for postdates pregnancy.  Labor Management -SVE 3/70/-2, posterior -Will begin pitocin  -Pain options reviewed -Anticipate SVD  Fetal Status: - cephalic presentation by 36 week BSUS and Leopolds - EFW: 7.5 by Leopolds - Continuous fetal monitoring - FHT currently cat 1   Labs/Immunizations: Prenatal labs and studies: ABO, Rh: --/--/A POS Performed at [facility], [address] [place], [place] [address]  [phone]) Antibody: NEG (08/08 0543) Rubella: 1.67 (01/28 1523) Varicella: Reactive (01/28 1523)  RPR: Non Reactive (05/15 0931)  HBsAg: Negative (01/28 1523)  HepC: Non Reactive (01/28 1523) HIV: Non Reactive (05/15 0931)  GBS: Negative/-- (07/08 1121)   TDAP: declined Flu: declined RSV: no  Postpartum Plan: - Feeding: Breast Milk and Formula - Contraception: plans undecided - Prenatal Care Provider: [facility]     HPI   Chief Complaint: Induction of labor  Misty Garza is a 23 y.o. G1P0 at [redacted]w[redacted]d who presents for induction of labor for postdates pregnancy    Pregnancy Complications Patient Active Problem List   Diagnosis Date Noted   Post-dates pregnancy 06/07/2024   Supervision of normal pregnancy 10/30/2023   ADHD (attention deficit hyperactivity disorder), inattentive type 12/12/2017   Skin cyst 11/01/2013    Review of Systems A twelve point review of systems was negative except as stated in HPI.   HISTORY   Medications Medications Prior to Admission  Medication Sig Dispense Refill Last Dose/Taking   Prenatal Vit-Fe Fumarate-FA (MULTIVITAMIN-PRENATAL) 27-0.8 MG TABS tablet Take 1 tablet by mouth daily at 12 noon.   06/06/2024    Allergies is allergic to amoxicillin.   OB History OB History  Gravida Para Term Preterm AB Living   1 0 0 0 0 0  SAB IAB Ectopic Multiple Live Births  0 0 0 0 0    # Outcome Date GA Lbr Len/2nd Weight Sex Type Anes PTL Lv  1 Current             Past Medical History Past Medical History:  Diagnosis Date   BV (bacterial vaginosis)    Chlamydia    Dysmenorrhea 11/15/2022    Past Surgical History Past Surgical History:  Procedure Laterality Date   tubs in ears  10/31/2000   WISDOM TOOTH EXTRACTION  2018   [age]    Social History  reports that she has never smoked. She has never used smokeless tobacco. She reports that she does not drink alcohol and does not use drugs.   Family History family history includes COPD in her maternal grandfather; Cancer in her paternal grandfather and paternal grandmother; Cancer (age of onset: 29) in her maternal grandfather; Cancer (age of onset: 60) in her maternal grandmother; Diabetes in her maternal grandfather; Healthy in her brother, father, and mother; Heart Problems in her maternal grandfather; Heart attack in her paternal grandfather; Kidney disease in her maternal grandfather.   PHYSICAL EXAM   Vitals:   06/07/24 0528 06/07/24 0530 06/07/24 0702  BP:  128/88 133/79  Pulse:  100 81  Resp: 16  14  Temp: 98.3 F (36.8 C)  98.7 F (37.1 C)  TempSrc: Oral  Oral  Weight: 84.8 kg    Height: 5' 7 (1.702 m)      Constitutional: No acute distress, well appearing, and well nourished. Neurologic: She is alert  and conversational.  Psychiatric: She has a normal mood and affect.  Musculoskeletal: Normal gait, grossly normal range of motion Cardiovascular: Normal rate.   Pulmonary/Chest: Normal work of breathing.  Gastrointestinal/Abdominal: Soft. Gravid. There is no tenderness.  Skin: Skin is warm and dry. No rash noted.  Genitourinary: Normal external female genitalia.  SVE:   Dilation: 3 Effacement (%): 70 Cervical Position: Posterior Station: -1 Presentation: Vertex Exam by:: D Burns, RN   NST Interpretation Indication:  Induction Baseline: 145 bpm Variability: moderate Accelerations: present Decelerations: absent Contractions: none Time noted:  See OBIX Impression: nonreactive Authenticated by: Damien PARSLEY   Attending Dr. Verdon was immediately available for the care of the patient.   Damien PARSLEY, CNM

## 2024-06-07 NOTE — Progress Notes (Signed)
 LABOR NOTE   Misty Garza 23 y.o.GP@ at [redacted]w[redacted]d  SUBJECTIVE:  Feeling better with epidural  Analgesia: Epidural  OBJECTIVE:  BP (!) 112/59   Pulse 85   Temp 98 F (36.7 C) (Oral)   Resp 16   Ht 5' 7 (1.702 m)   Wt 84.8 kg   LMP 09/02/2023 (Approximate)   SpO2 97%   BMI 29.29 kg/m  No intake/output data recorded.  She has shown cervical change. CERVIX: 6 cm:  90%:   -1:   posterior:   soft SVE:   Dilation: 4 Effacement (%): 80 Station: -1 Exam by:: [name] CNM CONTRACTIONS: regular, every 2-4 minutes FHR: Fetal heart tracing reviewed. Baseline: 145 bpm, Variability: Good {> 6 bpm), Accelerations: Reactive, and Decelerations: Early Category I    Labs: Lab Results  Component Value Date   WBC 15.8 (H) 06/07/2024   HGB 13.3 06/07/2024   HCT 39.0 06/07/2024   MCV 91.5 06/07/2024   PLT 242 06/07/2024    ASSESSMENT: 1) Labor curve reviewed.       Progress: Active phase labor.     Membranes: ruptured, meconium      Principal Problem:   Post-dates pregnancy   PLAN: continue present management   Zelda [name], CNM  06/07/2024 3:52 PM

## 2024-06-07 NOTE — Anesthesia Preprocedure Evaluation (Signed)
 Anesthesia Evaluation  Patient identified by MRN, date of birth, ID band Patient awake    Airway Mallampati: II   Neck ROM: full    Dental no notable dental hx.    Pulmonary neg pulmonary ROS   Pulmonary exam normal        Cardiovascular negative cardio ROS Normal cardiovascular exam     Neuro/Psych negative neurological ROS  negative psych ROS   GI/Hepatic negative GI ROS, Neg liver ROS,,,  Endo/Other  negative endocrine ROS    Renal/GU negative Renal ROS  negative genitourinary   Musculoskeletal   Abdominal   Peds  Hematology negative hematology ROS (+)   Anesthesia Other Findings   Reproductive/Obstetrics (+) Pregnancy                              Anesthesia Physical Anesthesia Plan  ASA: 1  Anesthesia Plan: Epidural   Post-op Pain Management:    Induction:   PONV Risk Score and Plan:   Airway Management Planned:   Additional Equipment:   Intra-op Plan:   Post-operative Plan:   Informed Consent: I have reviewed the patients History and Physical, chart, labs and discussed the procedure including the risks, benefits and alternatives for the proposed anesthesia with the patient or authorized representative who has indicated his/her understanding and acceptance.       Plan Discussed with: Anesthesiologist and CRNA  Anesthesia Plan Comments:         Anesthesia Quick Evaluation

## 2024-06-07 NOTE — Discharge Summary (Signed)
 Postpartum Discharge Summary  Date of Service updated 06/09/24     Patient Name: Misty Garza DOB: 03-Oct-2001 MRN: [id number]  Date of admission: 06/07/2024 Delivery date:06/07/2024 Delivering provider: [name] SHAM Date of discharge: 06/09/2024  Admitting diagnosis: Post-dates pregnancy [O48.0] Intrauterine pregnancy: [redacted]w[redacted]d     Secondary diagnosis:  Principal Problem:   Post-dates pregnancy Active Problems:   [redacted] weeks gestation of pregnancy   Labor and delivery, indication for care  Additional problems: none    Discharge diagnosis: Term Pregnancy Delivered                                              Post partum procedures:none Augmentation: AROM and Pitocin  Complications: None  Hospital course: Induction of Labor With Vaginal Delivery   23 y.o. yo G1P1001 at [redacted]w[redacted]d was admitted to the hospital 06/07/2024 for induction of labor.  Indication for induction: Postdates.  Patient had an labor course that was uncomplicated Membrane Rupture Time/Date: 11:57 AM,06/07/2024  Delivery Method:Vaginal, Spontaneous Operative Delivery:N/A Episiotomy: None Lacerations:  2nd degree;Vaginal Details of delivery can be found in separate delivery note.  Patient had a postpartum course uncomplicated. Patient is discharged home 06/09/24.  Newborn Data: Birth date:06/07/2024 Birth time:8:52 PM Gender:Female Living status:Living Apgars:9 ,9  Weight:4100 g  Magnesium Sulfate received: No BMZ received: No Rhophylac:N/A MMR:N/A T-DaP:declined  Flu: N/A RSV Vaccine received: No Transfusion:No Immunizations administered:  There is no immunization history on file for this patient.  Physical exam  Vitals:   06/08/24 1600 06/08/24 1932 06/08/24 2301 06/09/24 0913  BP: 105/82 122/76 (!) 104/56 (!) 86/51  Pulse: 75 88 83 61  Resp: 18 18 18 18   Temp: 98.2 F (36.8 C) 98.6 F (37 C) 97.9 F (36.6 C) 98.1 F (36.7 C)  TempSrc: Oral Oral Oral   SpO2: 99% 94% 100% 96%  Weight:      Height:        General: alert, cooperative, and no distress Lochia: appropriate Uterine Fundus: firm Incision: N/A DVT Evaluation: No evidence of DVT seen on physical exam. No cords or calf tenderness. No significant calf/ankle edema. Labs: Lab Results  Component Value Date   WBC 17.8 (H) 06/08/2024   HGB 11.0 (L) 06/08/2024   HCT 33.0 (L) 06/08/2024   MCV 92.4 06/08/2024   PLT 222 06/08/2024       No data to display         Edinburgh Score:    06/08/2024    6:27 PM  Edinburgh Postnatal Depression Scale Screening Tool  I have been able to laugh and see the funny side of things. 0  I have looked forward with enjoyment to things. 0  I have blamed myself unnecessarily when things went wrong. 1  I have been anxious or worried for no good reason. 1  I have felt scared or panicky for no good reason. 0  Things have been getting on top of me. 1  I have been so unhappy that I have had difficulty sleeping. 0  I have felt sad or miserable. 1  I have been so unhappy that I have been crying. 0  The thought of harming myself has occurred to me. 0  Edinburgh Postnatal Depression Scale Total 4      After visit meds:  Allergies as of 06/09/2024       Reactions   Amoxicillin Hives  Medication List     TAKE these medications    multivitamin-prenatal 27-0.8 MG Tabs tablet Take 1 tablet by mouth daily at 12 noon.         Discharge home in stable condition Infant Feeding: Breast Infant Disposition:home with mother Discharge instruction: per After Visit Summary and Postpartum booklet. Activity: Advance as tolerated. Pelvic rest for 6 weeks.  Diet: routine diet Anticipated Birth Control: Unsure Postpartum Appointment:2 weeks Additional Postpartum F/U: Postpartum Depression checkup Future Appointments:No future appointments. Follow up Visit:      06/09/2024 Zelda Hummer, CNM

## 2024-06-07 NOTE — Progress Notes (Signed)
 LABOR NOTE   Misty Garza 23 y.o.GP@ at [redacted]w[redacted]d  SUBJECTIVE:  Feeling some cramping Analgesia: Labor support without medications  OBJECTIVE:  BP (!) 140/76 (BP Location: Right Arm)   Pulse 81   Temp 98.1 F (36.7 C) (Oral)   Resp 16   Ht 5' 7 (1.702 m)   Wt 84.8 kg   LMP 09/02/2023 (Approximate)   BMI 29.29 kg/m  No intake/output data recorded.  She has shown cervical change. CERVIX: 4 cm:  80%:   -1:   posterior:   soft SVE:   Dilation: 4 Effacement (%): 80 Station: -1 Exam by:: Misty Garza CONTRACTIONS: regular, every 2 minutes FHR: Fetal heart tracing reviewed. Baseline: 140 bpm, Variability: Good {> 6 bpm), Accelerations: Reactive, and Decelerations: Absent Category I    Labs: Lab Results  Component Value Date   WBC 15.8 (H) 06/07/2024   HGB 13.3 06/07/2024   HCT 39.0 06/07/2024   MCV 91.5 06/07/2024   PLT 242 06/07/2024    ASSESSMENT: 1) Labor curve reviewed.       Progress: latent labor     Membranes: ruptured, bloody fluid            Principal Problem:   Post-dates pregnancy   PLAN: continue present management   Misty Garza, Garza  06/07/2024 12:02 PM

## 2024-06-07 NOTE — Anesthesia Procedure Notes (Addendum)
 Epidural Patient location during procedure: OB Start time: 06/07/2024 2:10 PM  Staffing Anesthesiologist: Dario Barter, MD Resident/CRNA: Dyane Mass, CRNA Performed: resident/CRNA   Preanesthetic Checklist Completed: patient identified, IV checked, site marked, risks and benefits discussed, surgical consent, monitors and equipment checked, pre-op evaluation and timeout performed  Epidural Patient position: sitting Prep: ChloraPrep Patient monitoring: heart rate, continuous pulse ox and blood pressure Approach: midline Location: L3-L4 Injection technique: LOR saline  Needle:  Needle type: Tuohy  Needle gauge: 17 G Needle length: 9 cm and 9 Needle insertion depth: 7 cm Catheter type: closed end flexible Catheter size: 19 Gauge Catheter at skin depth: 11 cm Test dose: negative and 1.5% lidocaine  with Epi 1:200 K  Assessment Sensory level: T10 Events: blood not aspirated, no cerebrospinal fluid, injection not painful, no injection resistance, no paresthesia and negative IV test  Additional Notes 2 attempt Pt. Evaluated and documentation done after procedure finished. Patient identified. Risks/Benefits/Options discussed with patient including but not limited to bleeding, infection, nerve damage, paralysis, failed block, incomplete pain control, headache, blood pressure changes, nausea, vomiting, reactions to medication both or allergic, itching and postpartum back pain. Confirmed with bedside nurse the patient's most recent platelet count. Confirmed with patient that they are not currently taking any anticoagulation, have any bleeding history or any family history of bleeding disorders. Patient expressed understanding and wished to proceed. All questions were answered. Sterile technique was used throughout the entire procedure. Please see nursing notes for vital signs. Test dose was given through epidural catheter and negative prior to continuing to dose epidural or start infusion.  Warning signs of high block given to the patient including shortness of breath, tingling/numbness in hands, complete motor block, or any concerning symptoms with instructions to call for help. Patient was given instructions on fall risk and not to get out of bed. All questions and concerns addressed with instructions to call with any issues or inadequate analgesia.    Patient tolerated the insertion well without immediate complications.Reason for block:procedure for pain

## 2024-06-08 LAB — CBC
HCT: 33 % — ABNORMAL LOW (ref 36.0–46.0)
Hemoglobin: 11 g/dL — ABNORMAL LOW (ref 12.0–15.0)
MCH: 30.8 pg (ref 26.0–34.0)
MCHC: 33.3 g/dL (ref 30.0–36.0)
MCV: 92.4 fL (ref 80.0–100.0)
Platelets: 222 K/uL (ref 150–400)
RBC: 3.57 MIL/uL — ABNORMAL LOW (ref 3.87–5.11)
RDW: 13.2 % (ref 11.5–15.5)
WBC: 17.8 K/uL — ABNORMAL HIGH (ref 4.0–10.5)
nRBC: 0 % (ref 0.0–0.2)

## 2024-06-08 NOTE — Plan of Care (Signed)
 VSS, tolerating regular diet, pain well controlled with PO meds, vaginal bleeding WNL, voiding, breast feeding well. Elyn Sharps, RN 06/08/24 @1845 

## 2024-06-08 NOTE — Progress Notes (Signed)
 Post Partum Day 0 Subjective: Misty Garza is feeling well overall. She is ambulating, voiding, and tolerating POs without difficulty. She is having uterine cramping that is managed with ibuprofen ; her bleeding is WNL. Her mood is stable. Breastfeeding is going well.   Objective: Blood pressure 105/65, pulse 80, temperature 97.8 F (36.6 C), temperature source Oral, resp. rate 18, height 5' 7 (1.702 m), weight 84.8 kg, last menstrual period 09/02/2023, SpO2 98%, unknown if currently breastfeeding.  Physical Exam:  General: alert, cooperative, and appears stated age 23: appropriate Uterine Fundus: firm Perineum: healing well DVT Evaluation: No evidence of DVT seen on physical exam.  Recent Labs    06/07/24 0543 06/08/24 0625  HGB 13.3 11.0*  HCT 39.0 33.0*    Assessment/Plan: Lactation assistance as needed Plan for discharge tomorrow - considering d/c tonight after 24 hours   LOS: 1 day   Eleanor CHRISTELLA Canny, CNM 06/08/2024, 12:32 PM

## 2024-06-08 NOTE — Lactation Note (Signed)
 This note was copied from a baby's chart. Lactation Consultation Note  Patient Name: Misty Garza Unijb'd Date: 06/08/2024 Age:25 hours Reason for consult: Initial assessment;Primapara;Term   Maternal Data Has patient been taught Hand Expression?: Yes Does the patient have breastfeeding experience prior to this delivery?: No  Feeding Mother's Current Feeding Choice: Breast Milk Mom assisted with latching baby to right breast in cradle hold, after hand expression, baby latches eagerly to breast, mom encouraged to ensure wide open mouth of baby to keep baby from latching to tip of nipple, encouraged mom to support under breast while baby nursing and to offer both breasts at a feeding as she is concerned baby may need to be supplemented because of baby's wt.    Encouraged frequent feedings at the breast  LATCH Score Latch: Grasps breast easily, tongue down, lips flanged, rhythmical sucking.  Audible Swallowing: Spontaneous and intermittent  Type of Nipple: Everted at rest and after stimulation  Comfort (Breast/Nipple): Soft / non-tender  Hold (Positioning): Assistance needed to correctly position infant at breast and maintain latch.  LATCH Score: 9   Lactation Tools Discussed/Used    Interventions Interventions: Breast feeding basics reviewed;Assisted with latch;Skin to skin;Hand express;Adjust position;Support pillows;Education LC name written on white board Discharge Pump: Personal WIC Program: Yes  Consult Status Consult Status: PRN    Misty Garza 06/08/2024, 3:25 PM

## 2024-06-08 NOTE — Anesthesia Postprocedure Evaluation (Signed)
 Anesthesia Post Note  Patient: Doctor, general practice  Procedure(s) Performed: AN AD HOC LABOR EPIDURAL  Patient location during evaluation: Mother Baby Anesthesia Type: Epidural Level of consciousness: awake and alert Pain management: pain level controlled Vital Signs Assessment: post-procedure vital signs reviewed and stable Respiratory status: spontaneous breathing, nonlabored ventilation and respiratory function stable Cardiovascular status: stable Postop Assessment: no headache, no backache and epidural receding Anesthetic complications: no   No notable events documented.   Last Vitals:  Vitals:   06/08/24 0500 06/08/24 0836  BP: 112/76 105/65  Pulse: 72 80  Resp: 18 18  Temp: 36.8 C 36.6 C  SpO2: 99% 98%    Last Pain:  Vitals:   06/08/24 1242  TempSrc:   PainSc: 8                  Lendia LITTIE Mae

## 2024-06-09 NOTE — Progress Notes (Signed)
 Patient discharged home with family. Discharge instructions, when to follow up, and prescriptions reviewed with patient. Patient verbalized understanding. Patient will be escorted out by auxiliary.

## 2024-06-27 ENCOUNTER — Encounter: Payer: Self-pay | Admitting: Obstetrics

## 2024-06-27 ENCOUNTER — Telehealth (INDEPENDENT_AMBULATORY_CARE_PROVIDER_SITE_OTHER): Admitting: Obstetrics

## 2024-06-27 NOTE — Progress Notes (Signed)
   Virtual Visit via Video Note   I connected with Misty Garza  on 06/27/24 4:11 PM by a video enabled telemedicine application and verified that I am speaking with the correct person using two identifiers.   Location: Patient: home Provider: [facility]   I discussed the limitations of evaluation and management by telemedicine and the availability of in person appointments. The patient expressed understanding and agreed to proceed. Subjective:    Subjective  Misty Garza is a 23 y.o. female who presents for a postpartum visit. She is 2 weeks  postpartum following a spontaneous vaginal delivery. I have reviewed the prenatal and intrapartum course. The delivery was at [redacted]w[redacted]d .  Anesthesia: epidural.    Postpartum course has been normal. She has no pain and no other concerns. Baby's course has been normal. Baby is feeding by  breast. Bleeding is light. Bowel function is normal. Bladder function is normal.  .  Contraception method: undecided. Postpartum depression screening:      Review of Systems Pertinent positives noted in HPI. Remainder of comprehensive ROS otherwise negative.     Objective:      Objective [] Expand by Default There were no vitals taken for this visit.  General:  alert, cooperative, and no distress        Assessment:    Normal postpartum course, mood stable.   Plan:      Plan -Anticipatory guidance about the postpartum period -Reviewed s/s of PPD and PPA -Discussed when to seek medical care -Office visit at 6 weeks postpartum  I discussed the assessment and treatment plan with the patient. The patient was provided an opportunity to ask questions and all were answered. The patient agreed with the plan and demonstrated an understanding of the instructions.   The patient was advised to call back or seek an in-person evaluation if the symptoms worsen or if the condition fails to improve as anticipated.   I provided 7 minutes of non-face-to-face time  during this encounter.     Omari Koslosky M Mija Effertz, CNM

## 2024-07-17 NOTE — Progress Notes (Unsigned)
   OBSTETRICS POSTPARTUM CLINIC PROGRESS NOTE  Subjective:     Misty Garza is a 23 y.o. G101P1001 female who presents for a postpartum visit. She is 5 week postpartum following a spontaneous vaginal delivery. I have fully reviewed the prenatal and intrapartum course. The delivery was at 40 gestational weeks.  Anesthesia: epidural. Postpartum course has been good. Baby's course has been well. Baby is feeding by breast. Bleeding: patient has not not resumed menses but reports continued bleeding since delivery. She notes that it did get to spotting but recently she was ill and had been taking some warm baths . Since then the bleeding has been heavier. Not passing clots , not saturating pads, not has heavy as her normal period but more than spotting. Notes that it is red in color. Bowel function is normal. Bladder function is normal. Patient is sexually active. Contraception method desired is none. Postpartum depression screening: negative.  EDPS score is 0.    The following portions of the patient's history were reviewed and updated as appropriate: allergies, current medications, past family history, past medical history, past social history, past surgical history, and problem list.  Review of Systems A comprehensive review of systems was negative.   Objective:    BP 112/81   Pulse 95   Wt 149 lb 6.4 oz (67.8 kg)   Breastfeeding Yes   BMI 23.40 kg/m   General:  alert and no distress   Breasts:  inspection negative, no nipple discharge or bleeding, no masses or nodularity palpable  Lungs: clear to auscultation bilaterally  Heart:  regular rate and rhythm, S1, S2 normal, no murmur, click, rub or gallop  Abdomen: soft, non-tender; bowel sounds normal; no masses,  no organomegaly.     Vulva:  normal  Vagina: normal vagina, blood noted on exam , no discharge, exudate, lesion, or erythema  Cervix:  no cervical motion tenderness and no lesions  Corpus: normal size, contour, position, consistency,  mobility, non-tender  Adnexa:  normal adnexa and no mass, fullness, tenderness  Rectal Exam: Not performed.         Labs:  Lab Results  Component Value Date   HGB 11.0 (L) 06/08/2024     Assessment:   1. Postpartum care and examination      Plan:    1. Contraception: condoms for now. 2. Pt request to wait for 2 more weeks then if bleeding has not resolved will order u/s.  3. Follow up in: 9 months for annual or as needed for continued bleeding.     [name] Sham, CNM [facility]

## 2024-07-18 ENCOUNTER — Ambulatory Visit: Admitting: Certified Nurse Midwife

## 2024-07-18 ENCOUNTER — Encounter: Payer: Self-pay | Admitting: Certified Nurse Midwife

## 2024-07-18 NOTE — Patient Instructions (Signed)

## 2024-08-06 ENCOUNTER — Encounter: Payer: Self-pay | Admitting: Certified Nurse Midwife

## 2024-08-06 ENCOUNTER — Other Ambulatory Visit (HOSPITAL_COMMUNITY)
Admission: RE | Admit: 2024-08-06 | Discharge: 2024-08-06 | Disposition: A | Discharge status: Home / Self Care | Source: Ambulatory Visit | Attending: Certified Nurse Midwife | Admitting: Certified Nurse Midwife

## 2024-08-06 ENCOUNTER — Ambulatory Visit (INDEPENDENT_AMBULATORY_CARE_PROVIDER_SITE_OTHER): Admitting: Certified Nurse Midwife

## 2024-08-06 NOTE — Progress Notes (Signed)
 GYN ENCOUNTER NOTE  Subjective:       Misty Garza is a 23 y.o. G33P1001 female is here for gynecologic evaluation of the following issues:  1. Vaginal Bleeding postpartum .  She notes she has continued to spot since delivery. She denies that it has worsened or changed. She notes that it is dark in color. She denies cramping or pain .    Gynecologic History No LMP recorded. Contraception: none Last Pap: 11/15/22. Results were: normal   Obstetric History OB History  Gravida Para Term Preterm AB Living  1 1 1   1   SAB IAB Ectopic Multiple Live Births     0 1    # Outcome Date GA Lbr Len/2nd Weight Sex Type Anes PTL Lv  1 Term 06/07/24 [redacted]w[redacted]d / 01:10 9 lb 0.6 oz (4.1 kg) F Vag-Spont EPI  LIV    Past Medical History:  Diagnosis Date   BV (bacterial vaginosis)    Chlamydia    Dysmenorrhea 11/15/2022    Past Surgical History:  Procedure Laterality Date   tubs in ears  10/31/2000   WISDOM TOOTH EXTRACTION  2018   four    Current Outpatient Medications on File Prior to Visit  Medication Sig Dispense Refill   Prenatal Vit-Fe Fumarate-FA (MULTIVITAMIN-PRENATAL) 27-0.8 MG TABS tablet Take 1 tablet by mouth daily at 12 noon.     No current facility-administered medications on file prior to visit.    Allergies  Allergen Reactions   Amoxicillin Hives    Social History   Socioeconomic History   Marital status: Single    Spouse name: Not on file   Number of children: 0   Years of education: 16   Highest education level: Not on file  Occupational History   Occupation: [employer]  Tobacco Use   Smoking status: Never   Smokeless tobacco: Never  Vaping Use   Vaping status: Former  Substance and Sexual Activity   Alcohol use: No   Drug use: No   Sexual activity: Yes    Partners: Male    Birth control/protection: None    Comment: undecided  Other Topics Concern   Not on file  Social History Narrative   Not on file   Social Drivers of Health   Financial  Resource Strain: Low Risk  (10/30/2023)   Overall Financial Resource Strain (CARDIA)    Difficulty of Paying Living Expenses: Not hard at all  Food Insecurity: No Food Insecurity (06/07/2024)   Hunger Vital Sign    Worried About Running Out of Food in the Last Year: Never true    Ran Out of Food in the Last Year: Never true  Transportation Needs: No Transportation Needs (06/07/2024)   PRAPARE - Administrator, Civil Service (Medical): No    Lack of Transportation (Non-Medical): No  Physical Activity: Inactive (10/30/2023)   Exercise Vital Sign    Days of Exercise per Week: 0 days    Minutes of Exercise per Session: 0 min  Stress: No Stress Concern Present (10/30/2023)   [employer] of Occupational Health - Occupational Stress Questionnaire    Feeling of Stress : Not at all  Social Connections: Unknown (10/30/2023)   Social Connection and Isolation Panel    Frequency of Communication with Friends and Family: More than three times a week    Frequency of Social Gatherings with Friends and Family: More than three times a week    Attends Religious Services: More than 4 times per year  Active Member of Clubs or Organizations: No    Attends Banker Meetings: Never    Marital Status: Not on file  Intimate Partner Violence: Not At Risk (06/08/2024)   Humiliation, Afraid, Rape, and Kick questionnaire    Fear of Current or Ex-Partner: No    Emotionally Abused: No    Physically Abused: No    Sexually Abused: No    Family History  Problem Relation Age of Onset   Healthy Mother    Healthy Father    Healthy Brother    Cancer Maternal Grandmother 34       lung   Diabetes Maternal Grandfather    Cancer Maternal Grandfather 11       lung   COPD Maternal Grandfather    Kidney disease Maternal Grandfather        stage 4   Heart Problems Maternal Grandfather    Cancer Paternal Grandmother        lung  2022   Cancer Paternal Grandfather        stomach   Heart  attack Paternal Grandfather     The following portions of the patient's history were reviewed and updated as appropriate: allergies, current medications, past family history, past medical history, past social history, past surgical history and problem list.  Review of Systems Review of Systems - Negative except as mentioned in HPI Review of Systems - General ROS: negative for - chills, fatigue, fever, hot flashes, malaise or night sweats Hematological and Lymphatic ROS: negative for - bleeding problems or swollen lymph nodes Gastrointestinal ROS: negative for - abdominal pain, blood in stools, change in bowel habits and nausea/vomiting Musculoskeletal ROS: negative for - joint pain, muscle pain or muscular weakness Genito-Urinary ROS: negative for - change in menstrual cycle, dysmenorrhea, dyspareunia, dysuria, genital discharge, genital ulcers, hematuria, incontinence, irregular/heavy menses, nocturia or pelvic painjj  Objective:   There were no vitals taken for this visit. CONSTITUTIONAL: Well-developed, well-nourished female in no acute distress.  HENT:  Normocephalic, atraumatic.  NECK: Normal range of motion, supple, no masses.  Normal thyroid.  SKIN: Skin is warm and dry. No rash noted. Not diaphoretic. No erythema. No pallor. NEUROLGIC: Alert and oriented to person, place, and time. PSYCHIATRIC: Normal mood and affect. Normal behavior. Normal judgment and thought content. CARDIOVASCULAR:Not Examined RESPIRATORY: Not Examined BREASTS: Not Examined ABDOMEN: Soft, non distended; Non tender.  No Organomegaly. PELVIC:  External Genitalia: Normal  BUS: Normal  Vagina: Normal, blood noted dark in color. No odor   Cervix: Normal, no polyp   MUSCULOSKELETAL: Normal range of motion. No tenderness.  No cyanosis, clubbing, or edema.     Assessment:  Abnormal uterine bleeding , postpartum    Plan:   Discussed possible causes including polyp, infection , and/or retained placental  fragments. U/s ordered. Vaginal swab collected. Will follow up with results.   Zelda Bashar Milam,CNM

## 2024-08-08 LAB — CERVICOVAGINAL ANCILLARY ONLY
Bacterial Vaginitis (gardnerella): NEGATIVE
Candida Glabrata: NEGATIVE
Candida Vaginitis: NEGATIVE
Chlamydia: NEGATIVE
Comment: NEGATIVE
Comment: NEGATIVE
Comment: NEGATIVE
Comment: NEGATIVE
Comment: NEGATIVE
Comment: NORMAL
Neisseria Gonorrhea: NEGATIVE
Trichomonas: NEGATIVE

## 2024-08-09 ENCOUNTER — Ambulatory Visit
Admission: RE | Admit: 2024-08-09 | Discharge: 2024-08-09 | Disposition: A | Discharge status: Home / Self Care | Source: Ambulatory Visit | Attending: Certified Nurse Midwife | Admitting: Certified Nurse Midwife

## 2024-08-09 DIAGNOSIS — N939 Abnormal uterine and vaginal bleeding, unspecified: Secondary | ICD-10-CM | POA: Diagnosis not present

## 2024-08-13 ENCOUNTER — Encounter: Payer: Self-pay | Admitting: Certified Nurse Midwife

## 2024-10-04 DIAGNOSIS — L089 Local infection of the skin and subcutaneous tissue, unspecified: Secondary | ICD-10-CM | POA: Diagnosis not present

## 2024-10-04 DIAGNOSIS — S01331A Puncture wound without foreign body of right ear, initial encounter: Secondary | ICD-10-CM | POA: Diagnosis not present
